# Patient Record
Sex: Male | Born: 1980
Health system: Southern US, Community
[De-identification: ages and names within clinical notes are randomized; demographics above are authoritative.]

## PROBLEM LIST (undated history)

## (undated) DIAGNOSIS — I10 Essential (primary) hypertension: Secondary | ICD-10-CM

## (undated) DIAGNOSIS — F419 Anxiety disorder, unspecified: Secondary | ICD-10-CM

## (undated) DIAGNOSIS — K602 Anal fissure, unspecified: Secondary | ICD-10-CM

## (undated) DIAGNOSIS — N189 Chronic kidney disease, unspecified: Secondary | ICD-10-CM

## (undated) DIAGNOSIS — K648 Other hemorrhoids: Secondary | ICD-10-CM

## (undated) DIAGNOSIS — K219 Gastro-esophageal reflux disease without esophagitis: Secondary | ICD-10-CM

## (undated) DIAGNOSIS — C801 Malignant (primary) neoplasm, unspecified: Secondary | ICD-10-CM

## (undated) DIAGNOSIS — K859 Acute pancreatitis without necrosis or infection, unspecified: Secondary | ICD-10-CM

## (undated) DIAGNOSIS — K5190 Ulcerative colitis, unspecified, without complications: Secondary | ICD-10-CM

## (undated) DIAGNOSIS — M1990 Unspecified osteoarthritis, unspecified site: Secondary | ICD-10-CM

## (undated) HISTORY — DX: Other hemorrhoids: K64.8

## (undated) HISTORY — PX: UMBILICAL HERNIA REPAIR: SHX196

## (undated) HISTORY — PX: APPENDECTOMY: SHX54

## (undated) HISTORY — DX: Unspecified osteoarthritis, unspecified site: M19.90

## (undated) HISTORY — DX: Gastro-esophageal reflux disease without esophagitis: K21.9

## (undated) HISTORY — DX: Anxiety disorder, unspecified: F41.9

## (undated) HISTORY — DX: Essential (primary) hypertension: I10

## (undated) HISTORY — PX: KNEE CARTILAGE SURGERY: SHX688

## (undated) HISTORY — DX: Chronic kidney disease, unspecified: N18.9

## (undated) HISTORY — PX: STOMACH SURGERY: SHX791

## (undated) HISTORY — DX: Acute pancreatitis without necrosis or infection, unspecified: K85.90

## (undated) HISTORY — PX: ANTERIOR CRUCIATE LIGAMENT REPAIR: SHX115

## (undated) HISTORY — PX: MENISCUS REPAIR: SHX5179

## (undated) HISTORY — DX: Malignant (primary) neoplasm, unspecified: C80.1

## (undated) HISTORY — DX: Ulcerative colitis, unspecified, without complications: K51.90

## (undated) HISTORY — DX: Anal fissure, unspecified: K60.2

---
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
## 2002-07-06 ENCOUNTER — Emergency Department (HOSPITAL_COMMUNITY): Admission: EM | Admit: 2002-07-06 | Discharge: 2002-07-06 | Payer: Self-pay | Admitting: Emergency Medicine

## 2003-04-28 ENCOUNTER — Emergency Department (HOSPITAL_COMMUNITY): Admission: EM | Admit: 2003-04-28 | Discharge: 2003-04-28 | Payer: Self-pay | Admitting: Emergency Medicine

## 2003-07-15 ENCOUNTER — Encounter (INDEPENDENT_AMBULATORY_CARE_PROVIDER_SITE_OTHER): Payer: Self-pay | Admitting: *Deleted

## 2003-07-15 ENCOUNTER — Inpatient Hospital Stay (HOSPITAL_COMMUNITY): Admission: EM | Admit: 2003-07-15 | Discharge: 2003-07-17 | Payer: Self-pay | Admitting: Emergency Medicine

## 2004-02-12 ENCOUNTER — Ambulatory Visit: Payer: Self-pay | Admitting: Internal Medicine

## 2004-07-25 ENCOUNTER — Ambulatory Visit: Payer: Self-pay | Admitting: Internal Medicine

## 2004-09-05 ENCOUNTER — Ambulatory Visit: Payer: Self-pay | Admitting: Internal Medicine

## 2004-09-08 ENCOUNTER — Ambulatory Visit: Payer: Self-pay | Admitting: Internal Medicine

## 2004-09-08 ENCOUNTER — Ambulatory Visit: Payer: Self-pay | Admitting: Cardiology

## 2004-09-12 ENCOUNTER — Encounter (INDEPENDENT_AMBULATORY_CARE_PROVIDER_SITE_OTHER): Payer: Self-pay | Admitting: *Deleted

## 2004-09-12 ENCOUNTER — Ambulatory Visit: Payer: Self-pay | Admitting: Internal Medicine

## 2004-10-11 ENCOUNTER — Ambulatory Visit: Payer: Self-pay | Admitting: Internal Medicine

## 2004-12-07 ENCOUNTER — Ambulatory Visit: Payer: Self-pay | Admitting: Internal Medicine

## 2005-01-02 ENCOUNTER — Ambulatory Visit: Payer: Self-pay | Admitting: Internal Medicine

## 2005-01-03 ENCOUNTER — Ambulatory Visit: Payer: Self-pay | Admitting: Internal Medicine

## 2005-01-09 ENCOUNTER — Ambulatory Visit (HOSPITAL_COMMUNITY): Admission: RE | Admit: 2005-01-09 | Discharge: 2005-01-10 | Payer: Self-pay | Admitting: Nephrology

## 2005-01-09 ENCOUNTER — Encounter (INDEPENDENT_AMBULATORY_CARE_PROVIDER_SITE_OTHER): Payer: Self-pay | Admitting: *Deleted

## 2005-01-26 ENCOUNTER — Emergency Department (HOSPITAL_COMMUNITY): Admission: EM | Admit: 2005-01-26 | Discharge: 2005-01-26 | Payer: Self-pay | Admitting: *Deleted

## 2005-02-01 ENCOUNTER — Ambulatory Visit: Payer: Self-pay | Admitting: Internal Medicine

## 2005-06-28 ENCOUNTER — Ambulatory Visit: Payer: Self-pay | Admitting: Internal Medicine

## 2005-08-11 ENCOUNTER — Ambulatory Visit: Payer: Self-pay | Admitting: Internal Medicine

## 2006-02-20 HISTORY — PX: KIDNEY TRANSPLANT: SHX239

## 2006-09-18 ENCOUNTER — Ambulatory Visit: Payer: Self-pay | Admitting: Internal Medicine

## 2006-09-19 ENCOUNTER — Encounter (INDEPENDENT_AMBULATORY_CARE_PROVIDER_SITE_OTHER): Payer: Self-pay | Admitting: *Deleted

## 2006-09-19 ENCOUNTER — Encounter: Payer: Self-pay | Admitting: Internal Medicine

## 2006-09-19 ENCOUNTER — Ambulatory Visit: Payer: Self-pay | Admitting: Internal Medicine

## 2006-09-21 DIAGNOSIS — N189 Chronic kidney disease, unspecified: Secondary | ICD-10-CM

## 2006-09-21 HISTORY — DX: Chronic kidney disease, unspecified: N18.9

## 2006-09-25 ENCOUNTER — Emergency Department (HOSPITAL_COMMUNITY): Admission: EM | Admit: 2006-09-25 | Discharge: 2006-09-26 | Payer: Self-pay | Admitting: Emergency Medicine

## 2006-12-21 ENCOUNTER — Ambulatory Visit: Payer: Self-pay | Admitting: Internal Medicine

## 2007-02-22 ENCOUNTER — Ambulatory Visit (HOSPITAL_COMMUNITY): Admission: RE | Admit: 2007-02-22 | Discharge: 2007-02-22 | Payer: Self-pay | Admitting: Emergency Medicine

## 2007-02-22 ENCOUNTER — Emergency Department (HOSPITAL_COMMUNITY): Admission: EM | Admit: 2007-02-22 | Discharge: 2007-02-22 | Payer: Self-pay | Admitting: Emergency Medicine

## 2007-04-16 DIAGNOSIS — N184 Chronic kidney disease, stage 4 (severe): Secondary | ICD-10-CM | POA: Insufficient documentation

## 2007-04-16 DIAGNOSIS — I10 Essential (primary) hypertension: Secondary | ICD-10-CM | POA: Insufficient documentation

## 2007-04-16 DIAGNOSIS — K5100 Ulcerative (chronic) pancolitis without complications: Secondary | ICD-10-CM | POA: Insufficient documentation

## 2007-07-23 ENCOUNTER — Emergency Department (HOSPITAL_COMMUNITY): Admission: EM | Admit: 2007-07-23 | Discharge: 2007-07-24 | Payer: Self-pay | Admitting: Emergency Medicine

## 2008-02-03 ENCOUNTER — Telehealth: Payer: Self-pay | Admitting: Internal Medicine

## 2008-02-20 ENCOUNTER — Ambulatory Visit: Payer: Self-pay | Admitting: Internal Medicine

## 2008-02-20 DIAGNOSIS — K602 Anal fissure, unspecified: Secondary | ICD-10-CM | POA: Insufficient documentation

## 2008-02-20 LAB — CONVERTED CEMR LAB
ALT: 18 units/L (ref 0–53)
AST: 16 units/L (ref 0–37)
Albumin: 4.3 g/dL (ref 3.5–5.2)
Alkaline Phosphatase: 59 units/L (ref 39–117)
BUN: 19 mg/dL (ref 6–23)
Basophils Absolute: 0 10*3/uL (ref 0.0–0.1)
CO2: 27 meq/L (ref 19–32)
CRP, High Sensitivity: <1 — ABNORMAL LOW (ref 0.00–5.00)
Chloride: 107 meq/L (ref 96–112)
Eosinophils Relative: 2.6 % (ref 0.0–5.0)
GFR calc non Af Amer: 55 mL/min
Glucose, Bld: 114 mg/dL — ABNORMAL HIGH (ref 70–99)
Lymphocytes Relative: 55.1 % — ABNORMAL HIGH (ref 12.0–46.0)
Monocytes Absolute: 0.7 10*3/uL (ref 0.1–1.0)
Monocytes Relative: 16.7 % — ABNORMAL HIGH (ref 3.0–12.0)
Neutrophils Relative %: 24.7 % — ABNORMAL LOW (ref 43.0–77.0)
Platelets: 232 10*3/uL (ref 150–400)
RBC: 4.35 M/uL (ref 4.22–5.81)
Total Bilirubin: 0.5 mg/dL (ref 0.3–1.2)
Total Protein: 6.6 g/dL (ref 6.0–8.3)
WBC: 4.3 10*3/uL — ABNORMAL LOW (ref 4.5–10.5)

## 2008-07-28 ENCOUNTER — Ambulatory Visit: Payer: Self-pay | Admitting: Internal Medicine

## 2008-07-28 DIAGNOSIS — K219 Gastro-esophageal reflux disease without esophagitis: Secondary | ICD-10-CM | POA: Insufficient documentation

## 2008-07-29 LAB — CONVERTED CEMR LAB
Alkaline Phosphatase: 61 units/L (ref 39–117)
BUN: 20 mg/dL (ref 6–23)
Basophils Absolute: 0 10*3/uL (ref 0.0–0.1)
Basophils Relative: 0.6 % (ref 0.0–3.0)
CO2: 30 meq/L (ref 19–32)
Calcium: 9.7 mg/dL (ref 8.4–10.5)
Chloride: 110 meq/L (ref 96–112)
Creatinine, Ser: 1.6 mg/dL — ABNORMAL HIGH (ref 0.4–1.5)
Eosinophils Absolute: 0 10*3/uL (ref 0.0–0.7)
Eosinophils Relative: 0.2 % (ref 0.0–5.0)
GFR calc non Af Amer: 54.83 mL/min (ref >60)
Hemoglobin: 13.6 g/dL (ref 13.0–17.0)
Lymphs Abs: 1.5 10*3/uL (ref 0.7–4.0)
Platelets: 237 10*3/uL (ref 150.0–400.0)
RDW: 12.5 % (ref 11.5–14.6)
Sodium: 141 meq/L (ref 135–145)
TSH: 1.04 microintl units/mL (ref 0.35–5.50)
Total Protein: 7.4 g/dL (ref 6.0–8.3)
WBC: 6 10*3/uL (ref 4.5–10.5)

## 2008-09-29 ENCOUNTER — Telehealth: Payer: Self-pay | Admitting: Internal Medicine

## 2008-09-30 ENCOUNTER — Ambulatory Visit: Payer: Self-pay | Admitting: Internal Medicine

## 2008-09-30 ENCOUNTER — Encounter: Payer: Self-pay | Admitting: Internal Medicine

## 2008-10-01 ENCOUNTER — Encounter: Payer: Self-pay | Admitting: Internal Medicine

## 2010-07-05 NOTE — Assessment & Plan Note (Signed)
Joe [name] OFFICE NOTE   Joe, [name]                  MRN:          [id number]  DATE:12/21/2006                            DOB:          20-Aug-1980    HISTORY:  Joe [name] presents today, two months after undergoing successful  renal transplantation at [facility].  He presents  today for routine followup as requested as well as complaints of rectal  bleeding.  He last underwent completely colonoscopy on September 19, 2006 pre-  transplant.  He was found to have mildly active universal ulcerative  colitis, both endoscopically and microscopically.  Post transplant, he  has been on prednisone 20 mg daily, Mifortic as directed, Prograf as  directed, Protonix 40 mg daily, aspirin, Bactrim 3 times weekly, and  Caltrate q.i.d.   He reports no problems with abdominal pain aside from some weakness and  discomfort associated with his surgical incision.  His bowel habits have  been a bit loose, though in an erratic nature.  His rectal bleeding is  red blood associated with pain with defecation.   PHYSICAL EXAMINATION:  A well-appearing male in no acute distress.  Blood pressure is 130/52, heart rate 100, weight is 253.6 pounds.  HEENT:  Sclerae are anicteric.  LUNGS:  Clear.  HEART:  Regular.  ABDOMEN:  Soft and nontender with well-healed incision in the right  lower quadrant with fullness consistent with the transplanted kidney.  No hernia.  RECTAL:  Posterior fissure.  No other abnormalities.   IMPRESSION:  1. Universal ulcerative colitis, seemingly stable.  Recent colonoscopy      with mildly active disease.  I am hopeful that his      immunosuppression therapy for his transplant will be beneficial for      his colitis.  2. Current problems with rectal pain and bleeding due to fissure.  3. Status post renal transplant in August, 2008.   RECOMMENDATIONS:  1. Fiber supplementation to  improve consistency of stools.  He has      been advised not to use fiber supplements around the time of his      other medications, as there may be interaction and decreased      absorption.  2. Anusol suppositories at night.  3. Sitz baths p.r.n.  4. Routine GI followup in six months, sooner if clinically indicated.  5. Ongoing post transplant care with [facility] personnel.     Joe [name]. Joe Pastor, MD  Electronically Signed    JNP/MedQ  DD: 12/21/2006  DT: 12/22/2006  Job #: [id number]   cc:   Joe [name], M.D.

## 2010-07-08 NOTE — H&P (Signed)
NAMEJUNUIS, VIDOVICH                     ACCOUNT NO.:  [id number]   MEDICAL RECORD NO.:  [id number]                   PATIENT TYPE:  INP   LOCATION:  0106                                 FACILITY:  [place]:  Edsel Petrin. Dalbert Batman, M.D.             DATE OF BIRTH:  May 04, 1980   DATE OF ADMISSION:  07/15/2003  DATE OF DISCHARGE:                                HISTORY & PHYSICAL   CHIEF COMPLAINT:  Abdominal pain and vomiting.   HISTORY OF PRESENT ILLNESS:  This is a 30 year old white male who has a  history of ulcerative colitis and a history of alcohol induced pancreatitis.  His ulcerative colitis has been relatively quiet lately and he is on minimal  medications. He reports a five day history of right lower quadrant pain  which has been getting worse. He has been nauseated. He vomited several  times today. His stool frequency has increased the last couple of days, but  he has not seen any blood. He denies any fever or chills. He was evaluated  by Dr. Delfin Edis in the emergency room. A CT scan was performed which  shows a dilated appendix and some inflammatory changes consistent with acute  appendicitis. No other acute abnormalities were noted. The colon did not  look inflamed. I was asked to see him. He is admitted for appendectomy.   PAST MEDICAL HISTORY:  1. Ulcerative colitis diagnosed in January 2000.  2. Alcohol induced pancreatitis in 1998.  3. Status post one open and two arthroscopic surgeries of the left knee.  4. History of acne with Accutane use in the past.  5. Gastroesophageal reflux disease.   CURRENT MEDICATIONS:  Asacol 400 mg t.i.d., multivitamins, Darvocet N-100  p.r.n., and Aciphex p.r.n.   DRUG ALLERGIES:  None known.   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  He is married and they  have no children. Denies tobacco. Drinks occasionally three beers at a time,  last time on Sunday, Jul 12, 2003.   FAMILY HISTORY:  Mother and father  are here with him. They are healthy and  have no major medical problems. There is no family history of cancer,  inflammatory bowel disease, peptic ulcer disease, hypertension, coronary  artery disease, or stroke.   REVIEW OF SYSTEMS:  Recent re-injury of his left knee. Urination has been  normal. No chills or rigors. He has been diaphoretic. Otherwise, review of  systems is negative.   PHYSICAL EXAMINATION:  GENERAL: Somewhat somnolent, but arousable and  appropriate young white man in moderate distress. He does look ill.  He is  somewhat overweight.  VITAL SIGNS: Temperature 97.1, pulse 65, respirations 22, blood pressure  137/92.  HEENT:  Eyes--sclera clear. Extraocular movements intact. Ears, nose, mouth,  throat, lips, tongue, and oropharynx are without gross lesions.  NECK: Supple and nontender. No adenopathy. No tenderness. No jugular venous  distention.  LUNGS: Clear to auscultation. No chest  wall tenderness.  HEART: Regular rate and rhythm with no murmur.  BREASTS: Not examined.  ABDOMEN: Mostly soft, except in the right upper lobe where he has exquisite  tenderness with involuntary guarding and direct rebound. I do not feel a  mass, but he really is guarding too much to know for sure.  Bowel sounds are  diminished. I see no scars or hernias.  GENITOURINARY: All of his pubic hair has been shaved. I do not see any  inguinal adenopathy, hernias, or penile or scrotal lesions.  EXTREMITIES: He moves all four extremities well without any pain or  deformity.  NEUROLOGIC: No gross motor or sensory deficits.   ADMISSION DATA:  CT scan shows a dilated thick walled appendix with  surrounding inflammatory changes, but no abscess.   Hemoglobin 15.2, white blood cell count 13,400.  Comprehensive metabolic  panel normal. Amylase 58, lipase 18. Urinalysis normal.   ASSESSMENT:  1. Acute appendicitis.  2. History of ulcerative colitis.  3. History of alcohol induced pancreatitis.    PLAN:  1. The patient will be taken to the operating room for diagnostic     laparoscopy and appendectomy.  2. I have discussed the indications and details of surgery with the patient,     his wife, and both of his parents. The risks and complications have been     outlined, including but not limited to bleeding, postoperative infection,     conversion to open laparotomy, injury to adjacent organs, reoperation for     complications, wound problem such as infection or hernia, or cardiac,     pulmonary, or thromboembolic problems. They all seem to understand these     issues well. At this time everyone's questions are answered. The patient     is in agreement with this plan.                                               Edsel Petrin. Dalbert Batman, M.D.    HMI/MEDQ  D:  07/15/2003  T:  07/15/2003  Job:  [id number]   cc:   Delfin Edis, M.D. [facility]

## 2010-07-08 NOTE — Discharge Summary (Signed)
NAMEJUELZ, NEWSUM                     ACCOUNT NO.:  [id number]   MEDICAL RECORD NO.:  [id number]                   PATIENT TYPE:  INP   LOCATION:  [id number]                                 FACILITY:  [place]:  Edsel Petrin. Dalbert Batman, M.D.             DATE OF BIRTH:  Jul 02, 1980   DATE OF ADMISSION:  07/15/2003  DATE OF DISCHARGE:  07/17/2003                                 DISCHARGE SUMMARY   FINAL DIAGNOSES:  1. Acute appendicitis.  2. History of ulcerative colitis.  3. History of alcohol induced pancreatitis.   OPERATION:  Laparoscopic appendectomy -- date 07/15/2003.   HISTORY:  This is a 30 year old white male with a history of ulcerative  colitis and a history of alcohol induced pancreatitis in the past.  Both  problems had been relatively quiet lately on minimal medications.  He  presents with a 5-day history of right lower quadrant pain getting worse  with nausea and vomiting. He came to the emergency room.  He was evaluated  by Dr. Delfin Edis.  A CT scan showed a dilated appendix and some  inflammatory changes in the area consistent with acute appendicitis.  I was  called to see him.   PHYSICAL EXAMINATION:  GENERAL:  A somewhat overweight young man in mild  distress.  VITAL SIGNS:  Temperature 97.1, pulse 65, respirations 22.  LUNGS:  Clear to auscultation.  HEART:  Regular rate and rhythm.  No murmur.  ABDOMEN:  Soft.  There was some right-sided abdominal tenderness which was  localized with direct guarding and direct rebound, but no mass.   ADMISSION DATA:  White blood cell count 13,400.  The rest of his lab work  looked normal.  The CT scan was discussed above.   HOSPITAL COURSE:  The patient was taken to the operating room and underwent  laparoscopic appendectomy.  He did have an acutely inflamed appendix.  The  final pathology report did show acute appendicitis.  Postoperatively he did  relatively well.  He progressed in his diet and activities without  problems  and was discharged home on 07/17/2003.  At that time he was afebrile, alert,  tolerating a regular diet; was having loose stools as usual.  His abdomen  was soft; and his wounds looked fine.  He was asked to return to see me in  the office in 3 weeks.                                               Edsel Petrin. Dalbert Batman, M.D.    HMI/MEDQ  D:  07/26/2003  T:  07/26/2003  Job:  [id number]   cc:   Biagio Borg, M.D. [facility]   Delfin Edis, M.D. [facility]

## 2010-07-08 NOTE — Op Note (Signed)
Joe [name], Joe [name]                     ACCOUNT NO.:  [id number]   MEDICAL RECORD NO.:  [id number]                   PATIENT TYPE:  INP   LOCATION:  0106                                 FACILITY:  [place]:  Edsel Petrin. Dalbert Batman, M.D.             DATE OF BIRTH:  1980-07-06   DATE OF PROCEDURE:  07/15/2003  DATE OF DISCHARGE:                                 OPERATIVE REPORT   PREOPERATIVE DIAGNOSES:  Acute appendicitis.   POSTOPERATIVE DIAGNOSES:  Acute appendicitis.   OPERATION PERFORMED:  Laparoscopic appendectomy.   SURGEON:  Edsel Petrin. Dalbert Batman, M.D.   INDICATIONS FOR PROCEDURE:  This is a 30 year old white man with a history  of ulcerative colitis.  He presents with a 4-5 day history of right lower  quadrant pain and nausea.  For the past 3-4 hours, he has vomited multiple  times.  His stool frequency has increased but he has not seen any blood.  On  exam, he had localized tenderness in the right lower quadrant. A CT scan was  consistent with acute appendicitis. He was brought to the operating room  urgently.   FINDINGS:  The patient had acute appendicitis. The appendix was markedly  inflamed and quite large and swollen but it had not ruptured and there was  no an abscess.  There was inflammatory peel where the appendix was tracking  up the right pericolic gutter and against the pericolonic fat but we were  able to tease this bluntly apart without any injury to the colon. The  terminal ileum looked normal. The liver and gallbladder looked normal. The  pelvis looked normal except for a scant amount of watery bloody fluid.   TECHNIQUE:  Following the induction of general endotracheal anesthesia, the  patient's abdomen was prepped and draped in a sterile fashion.  Marcaine  0.5% with epinephrine was used as a local infiltration anesthetic.  A  transverse incision was made at the superior rim of the umbilicus.  The  fascia was incised in the midline and the abdominal  cavity entered under  direct vision. A 10 mm Hasson trocar was inserted and secured with a  pursestring suture of #0 Vicryl. Pneumoperitoneum was created. The video  camera was inserted with visualization and findings as described above.  A 5  mm trocar was placed in the right upper quadrant and a 12 mm trocar placed  in the suprapubic area.  We gently dissected some omentum off of the cecum  and the terminal ileum where it was adherent. We could then identify the  appendix coursing retrograde up the right pericolic gutter. With blunt  dissection, we carefully teased the appendix and the cecum apart until we  were able to flip the tip of the appendix up and control the tip of the  appendix with an Endoloop tie. Using the harmonic scalpel, we mobilized the  appendix further from its lateral attachments and then dissected through the  mesoappendix and appendiceal artery with the harmonic scalpel. We were able  to dissect the appendix all the way back to its insertion on the cecum and  to clearly see this insertion at which point the appendix became soft and  looked normal. We set up the stapled division of the appendix. We used an  Insurance underwriter. This was placed over the base of the appendix at the leve  of the cecum, held in place for about 30 seconds and then fired and removed.  The staple line was complete and looks quite secure. The appendix was placed  in a specimen bag and removed.  The operative field and the pelvis were  irrigated with saline. There was no bleeding whatsoever. The staple line was  again inspected and looked fine. The trocars were removed under direct  vision and there was no bleeding from the trocar sites. The pneumoperitoneum  was released.  The fascia at the umbilicus and the fascia in the suprapubic  trocar site were closed with #0 Vicryl sutures.  After irrigating the  wounds, we closed  the skin with subcuticular sutures of 4-0 Vicryl and Steri-Strips. Clean   bandages were placed and the patient taken to the recovery room in stable  condition. Estimated blood loss was about 20 mL.  Complications none.  Sponge, needle and instrument counts were correct.                                               Edsel Petrin. Dalbert Batman, M.D.    HMI/MEDQ  D:  07/15/2003  T:  07/15/2003  Job:  [id number]   cc:   Delfin Edis, M.D. [facility]

## 2010-07-08 NOTE — Op Note (Signed)
Joe [name], Joe [name]           ACCOUNT NO.:  [id number]   MEDICAL RECORD NO.:  [id number]          PATIENT TYPE:  OIB   LOCATION:  5502                         FACILITY:  [facility]   PHYSICIAN:  Windy Kalata, M.D.DATE OF BIRTH:  1980-04-16   DATE OF PROCEDURE:  01/09/2005  DATE OF DISCHARGE:                                 OPERATIVE REPORT   TYPE OF PROCEDURE:  Left percutaneous renal biopsy.   REASON:  Renal insufficiency of unknown etiology.   PROCEDURE NOTE:  Risks of procedure explained to patient and informed  consent obtained.  The left flank was prepped and draped in sterile fashion.  Local anesthesia was obtained with 10 cc of 1% lidocaine.  Under direct  ultrasound guidance, a 15-gauge ASAP biopsy needle was placed in the  inferior pole of the left kidney on two occasions with the return of two  cores of renal tissue.  The patient tolerated the procedure well.           ______________________________  Windy Kalata, M.D.     MTM/MEDQ  D:  01/09/2005  T:  01/09/2005  Job:  [id number]

## 2010-07-08 NOTE — H&P (Signed)
Joe [name], Joe [name]                     ACCOUNT NO.:  [id number]   MEDICAL RECORD NO.:  [id number]                   PATIENT TYPE:  INP   LOCATION:  0106                                 FACILITY:  [facility]   PHYSICIAN:  Delfin Edis, M.D. [facility]               DATE OF BIRTH:  08/26/1980   DATE OF ADMISSION:  07/15/2003  DATE OF DISCHARGE:                                HISTORY & PHYSICAL   CHIEF COMPLAINT:  Severe right-sided abdominal pain.   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 He has a history of alcoholic pancreatitis at age 41. He has  not had any recurrences of this. In 2000 he was diagnosed with colonoscopy  with universal ulcerative colitis which was described as mild by Dr. Henrene Pastor.  Since then he has been on Asacol and currently takes 1.6 gm t.i.d. He also  takes multivitamins and PPI.  GI-wise, he has been doing very well with  little if any GI symptoms other than occasional reflux and has generally one  to two bowel movements a day. Some times they are loose, but often they are  formed. He has not seen any rectal bleeding.  He has not had any significant  abdominal pain or weight loss. His appetite is good.   Four days ago he had onset of right-sided abdominal pain, it worsened  yesterday. He got some temporary relief after taking two Darvocet yesterday.  He was set up to see Dr. Henrene Pastor at about 2:00 today, but he had recurrence of  pain on a severity of 10/10 this morning and was sent to the emergency room  for evaluation. He has had some mild queasiness and this turned to nausea,  and he had his first episodes of a minor amount of emesis this morning  before getting to the emergency room and then had a bigger episode of emesis  after getting the Demerol in the emergency room.   Labs here in the ED show only an increase in white blood cell count to 13.4  thousand and a slight increase in his ALT up to 46.  Otherwise,  comprehensive metabolic profile, lipase, and amylase are normal. He is  drinking contrast now in order to get a CT scan of the abdomen.   ALLERGIES:  He denies.   CURRENT MEDICATIONS:  1. Asacol 400 mg four p.o. t.i.d.  2. Multivitamins once daily.  3. Darvocet N-100 as needed, rarely uses this.  4. Aciphex p.r.n., rarely uses.   PAST MEDICAL HISTORY:  1. Alcoholic pancreatitis in approximately 1998.  2. History of ulcerative colitis diagnosed in 2000.  3. Status post one open and two arthroscopic surgeries to his left knee;     recent re-injury of the same knee and may need another arthroscopy.  4. History of acne treated with Accutane in the past.  5. Gastroesophageal reflux disease.   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 No tobacco. He admits to about three beers at a  time on occasion and he last drank on Sunday, May 22nd.   FAMILY HISTORY:  No cancer. No inflammatory bowel disease. No  peptic ulcer  disease. No hypertension. No coronary artery disease. No strokes. Generally,  negative for any significant family medical history.   REVIEW OF SYSTEMS:  ORTHOPEDIC:  Again, recent re-injury of his left knee.  He still has a scab/scar on the left knee which was injured again about  three weeks ago. Dr. Metta Clines follows and he may need a rescope of the  knee. No other arthralgias. No sacroiliac pain.  HEENT:  No visual problems.  No difficulty swallowing. GU: He is urinating normally and the urine does  not look funny.  ID: Some chills. No rigors. Some diaphoresis this morning.  Otherwise, review of systems is  negative.   PHYSICAL EXAMINATION:  GENERAL: The patient is somnolent having had Demerol  50 mg earlier, but he is not confused when he is awake.  VITAL SIGNS: Temperature 97.1, blood pressure 137/92, pulse 65, respirations  22.  HEENT: Extraocular movements intact. There is no conjunctival pallor. No   icterus. The oropharynx is moist. There is some slight nonspecific coating  on the tongue, but there is no lesion or bleeding.  NECK: No JVD, no masses, no thyromegaly.  CHEST: Clear to auscultation and percussion bilaterally.  COR: Regular rate and rhythm. No murmurs, rubs, or gallops.  ABDOMEN: Soft and quiet. There is diffuse tenderness with some early rebound  in the right lower quadrant. No guarding.  RECTAL EXAM: Deferred.  EXTREMITIES: No clubbing, cyanosis, or edema.  NEUROLOGIC: He is sleepy, but awakens with some difficulty. He is alert and  oriented times three when awake. There is no tremor. Grip is 5/5  bilaterally.  PSYCHIATRIC: The patient is appropriate, but again sleepy.   LABORATORY DATA:  White blood cell count 13.4 with differential showing  neutrophils within normal limits, increasing granulocyte count of 10.3.  Lymphs, monos, eos, and basophils are all within normal limits. Hemoglobin  15.2, hematocrit 44.5, platelet count 303,000. MCV within normal limits.  Amylase is 58, lipase 18, total bilirubin 0.7, alkaline phosphatase 77, AST  34, ALT 46.  CT scan of the abdomen and pelvis pending. Urinalysis pending.   ASSESSMENT:  1. Acute abdominal pain. The history does not seem to be consistent with a     flare of ulcerative colitis, but since he has this history, need to rule     it out. Also need to rule out pancreatitis, though the amylase and lipase     are within normal limits. Question whether this is appendicitis, question     nephrolithiasis, question biliary origin to the pain.  2. History of ulcerative colitis.  3. History of alcoholic pancreatitis. Again, the amylase and lipase are     within normal limits.  4. Repeated history of knee injury requiring several surgeries.   PLAN:  Await the CT scan and obtain urinalysis.  The patient is to be  admitted to Dr. Sydell Axon Brodie's service until the source of his pain is  figured out and he improves.    Azucena Freed, P.A. [facility]                   Delfin Edis, M.D. [facility]    SG/MEDQ  D:  07/15/2003  T:  07/15/2003  Job:  [id number]

## 2010-09-05 ENCOUNTER — Other Ambulatory Visit: Payer: Self-pay | Admitting: Internal Medicine

## 2010-09-05 DIAGNOSIS — K602 Anal fissure, unspecified: Secondary | ICD-10-CM

## 2010-09-06 MED ORDER — LIDOCAINE-HYDROCORTISONE ACE 3-0.5 % RE CREA: 1.0000 | TOPICAL_CREAM | Freq: Two times a day (BID) | RECTAL | Status: DC

## 2010-09-06 NOTE — Telephone Encounter (Signed)
Rx Sent to pharmacy.  

## 2010-10-26 ENCOUNTER — Encounter: Payer: Self-pay | Admitting: Internal Medicine

## 2010-11-17 LAB — CBC
HCT: 32.8 — ABNORMAL LOW
RBC: 3.73 — ABNORMAL LOW
RDW: 13.1
WBC: 6.8

## 2010-11-17 LAB — POCT I-STAT, CHEM 8
BUN: 25 — ABNORMAL HIGH
Chloride: 105
HCT: 33 — ABNORMAL LOW
Hemoglobin: 11.2 — ABNORMAL LOW
Sodium: 139

## 2010-11-17 LAB — DIFFERENTIAL
Basophils Absolute: 0.1
Basophils Relative: 2 — ABNORMAL HIGH
Eosinophils Relative: 1
Lymphs Abs: 2.2
Monocytes Absolute: 0.6
Monocytes Relative: 9

## 2010-11-17 LAB — URINALYSIS, ROUTINE W REFLEX MICROSCOPIC
Hgb urine dipstick: NEGATIVE
Nitrite: NEGATIVE
Protein, ur: NEGATIVE
Specific Gravity, Urine: 1 — ABNORMAL LOW

## 2010-12-05 LAB — URINALYSIS, ROUTINE W REFLEX MICROSCOPIC
Bilirubin Urine: NEGATIVE
Ketones, ur: NEGATIVE
Urobilinogen, UA: 0.2

## 2010-12-05 LAB — URINE MICROSCOPIC-ADD ON

## 2010-12-05 LAB — COMPREHENSIVE METABOLIC PANEL
AST: 12
CO2: 21
Calcium: 9.3
GFR calc Af Amer: 12 — ABNORMAL LOW
Glucose, Bld: 98
Total Protein: 7

## 2010-12-05 LAB — DIFFERENTIAL
Basophils Relative: 0
Monocytes Absolute: 1 — ABNORMAL HIGH
Neutro Abs: 15.5 — ABNORMAL HIGH

## 2010-12-05 LAB — CBC
MCHC: 33.7
RBC: 3.72 — ABNORMAL LOW
RDW: 13.8
WBC: 17.7 — ABNORMAL HIGH

## 2010-12-30 DIAGNOSIS — Z940 Kidney transplant status: Secondary | ICD-10-CM | POA: Insufficient documentation

## 2010-12-30 DIAGNOSIS — D849 Immunodeficiency, unspecified: Secondary | ICD-10-CM | POA: Insufficient documentation

## 2010-12-30 DIAGNOSIS — I151 Hypertension secondary to other renal disorders: Secondary | ICD-10-CM | POA: Insufficient documentation

## 2011-11-22 ENCOUNTER — Encounter: Payer: Self-pay | Admitting: Internal Medicine

## 2012-05-22 ENCOUNTER — Telehealth: Payer: Self-pay | Admitting: Internal Medicine

## 2012-05-22 NOTE — Telephone Encounter (Signed)
Received 6 pages from [facility], sent to Dr. Henrene Pastor. 05/22/12/ss

## 2014-06-22 ENCOUNTER — Telehealth: Payer: Self-pay | Admitting: General Practice

## 2014-06-22 ENCOUNTER — Encounter: Payer: Self-pay | Admitting: Internal Medicine

## 2014-06-22 NOTE — Telephone Encounter (Signed)
Pt called in and would like to see if you would take him back on as pt. He was a pt 19 years ago   Best number [phone]

## 2014-06-22 NOTE — Telephone Encounter (Signed)
Ok with me 

## 2014-08-18 ENCOUNTER — Encounter: Payer: Self-pay | Admitting: Internal Medicine

## 2014-08-18 ENCOUNTER — Ambulatory Visit (INDEPENDENT_AMBULATORY_CARE_PROVIDER_SITE_OTHER): Payer: 59 | Admitting: Internal Medicine

## 2014-08-18 VITALS — BP 122/80 | HR 72 | Ht 74.0 in | Wt 279.5 lb

## 2014-08-18 DIAGNOSIS — K219 Gastro-esophageal reflux disease without esophagitis: Secondary | ICD-10-CM

## 2014-08-18 DIAGNOSIS — K51919 Ulcerative colitis, unspecified with unspecified complications: Secondary | ICD-10-CM

## 2014-08-18 DIAGNOSIS — Z1211 Encounter for screening for malignant neoplasm of colon: Secondary | ICD-10-CM | POA: Diagnosis not present

## 2014-08-18 DIAGNOSIS — R790 Abnormal level of blood mineral: Secondary | ICD-10-CM

## 2014-08-18 MED ORDER — NA SULFATE-K SULFATE-MG SULF 17.5-3.13-1.6 GM/177ML PO SOLN: 1.0000 | Freq: Once | ORAL | Status: DC

## 2014-08-18 NOTE — Progress Notes (Signed)
HISTORY OF PRESENT ILLNESS:  Joe [name] is a 34 y.o. 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 He was last evaluated 09/30/2008 when he underwent surveillance colonoscopy. Initial diagnosis of ulcerative colitis in 2000. The colon was grossly normal. Biopsies in the proximal colon revealed chronic mildly active colitis and edema with rare granuloma. Biopsies from the left colon were normal. Follow-up in 2 years recommended. He acknowledges that he is overdue for surveillance. He continues on immunosuppressive medication as listed. His Nephrologist at [facility] Earlier Today. Doing Extremely Well with Current Creatinine 1.19. Magnesium Level Has Been Minimally Low for Which His Nephrologist Suggested Changing from Pantoprazole to Ranitidine. The patient finds that off PPI severe heartburn. He denies dysphagia. He reports 2-4 formed bowel movements per day, which is his norm. No mucus, blood, or pain. He is accompanied today by his 37-year-old son  REVIEW OF SYSTEMS:  All non-GI ROS negative except for anxiety  Past Medical History  Diagnosis Date  . Ulcerative colitis   . Hypertension   . Chronic renal failure     post kidney transplantation  . GERD (gastroesophageal reflux disease)   . Anal fissure   . Internal hemorrhoids   . Pancreatitis     Past Surgical History  Procedure Laterality Date  . Anterior cruciate ligament repair Left   . Knee cartilage surgery Left     x2  . Meniscus repair Left   . Appendectomy    . Kidney transplant  2008    Social History Joe [name]  reports that he has never smoked. He quit smokeless tobacco use about 9 years ago. His smokeless tobacco use included Chew. He reports that he drinks alcohol. He reports that he does not use illicit drugs.  family history  includes Diabetes in his paternal grandmother; Hyperthyroidism in his father; Kidney cancer in his maternal grandfather.  Allergies  Allergen Reactions  . Mercaptopurine Other (See Comments)    Other Reaction: fever, chills  . Mesalamine Other (See Comments)    Other Reaction: ESRD       PHYSICAL EXAMINATION: Vital signs: BP 122/80 mmHg  Pulse 72  Ht 6\' 2"  (1.88 m)  Wt 279 lb 8 oz (126.78 kg)  BMI 35.87 kg/m2  Constitutional: generally well-appearing, no acute distress Psychiatric: alert and oriented x3, cooperative Eyes: extraocular movements intact, anicteric, conjunctiva pink Mouth: oral pharynx moist, no lesions Neck: supple no lymphadenopathy Cardiovascular: heart regular rate and rhythm, no murmur Lungs: clear to auscultation bilaterally Abdomen: soft, obese, nontender, nondistended, no obvious ascites, no peritoneal signs, normal bowel sounds, no organomegaly. Right lower quadrant scar well healed without hernia Rectal: Deferred until colonoscopy Extremities: no lower extremity edema bilaterally Skin: no lesions on visible extremities Neuro: No focal deficits. No asterixis.   ASSESSMENT:  #1. Long-standing history of universal ulcerative colitis. Last surveillance colonoscopy August 2010. Minimal proximal chronic colitis without dysplasia. #2. Status post renal transplantation on chronic immunosuppressive therapy #3. Chronic GERD. Well controlled on PPI. #4. Slightly low magnesium. Nephrologist wondered if this was a PPI effect. Not certain, but patient may be able to take a simple magnesium supplement if okay with his nephrologist since he seems to have incapacitating heartburn off PPI   PLAN:  #1. Surveillance colonoscopy with biopsies.The nature of the procedure, as well as the risks, benefits, and alternatives were carefully and thoroughly reviewed with the patient.  Ample time for discussion and questions allowed. The patient understood, was satisfied, and agreed  to proceed. Su prep #2. Patient discuss with nephrologist prospects of magnesium supplementation

## 2014-08-18 NOTE — Patient Instructions (Signed)

## 2014-10-07 ENCOUNTER — Ambulatory Visit (AMBULATORY_SURGERY_CENTER): Payer: 59 | Admitting: Internal Medicine

## 2014-10-07 ENCOUNTER — Encounter: Payer: Self-pay | Admitting: Internal Medicine

## 2014-10-07 VITALS — BP 102/65 | HR 71 | Temp 96.9°F | Resp 29 | Ht 74.0 in | Wt 279.0 lb

## 2014-10-07 DIAGNOSIS — K51919 Ulcerative colitis, unspecified with unspecified complications: Secondary | ICD-10-CM

## 2014-10-07 DIAGNOSIS — Z8719 Personal history of other diseases of the digestive system: Secondary | ICD-10-CM

## 2014-10-07 DIAGNOSIS — K5150 Left sided colitis without complications: Secondary | ICD-10-CM | POA: Diagnosis not present

## 2014-10-07 DIAGNOSIS — K529 Noninfective gastroenteritis and colitis, unspecified: Secondary | ICD-10-CM | POA: Diagnosis not present

## 2014-10-07 MED ORDER — SODIUM CHLORIDE 0.9 % IV SOLN: 500.0000 mL | INTRAVENOUS | Status: DC

## 2014-10-07 NOTE — Progress Notes (Signed)
Called to room to assist during endoscopic procedure.  Patient ID and intended procedure confirmed with present staff. Received instructions for my participation in the procedure from the performing physician.  

## 2014-10-07 NOTE — Progress Notes (Signed)
Epic down for procedure  Paper chart used for assesment and meds and inputed post procedure  Vitals were supposed to be recorded and printed out so they were not written down on paper  Post procedure though, they were erased.  Pt vitals were stable through out with no issues but they were not recorded.  Report to PACU, RN, vss, BBS= Clear.

## 2014-10-07 NOTE — Op Note (Signed)
[place]  [address] [place], [id number]   COLONOSCOPY PROCEDURE REPORT  PATIENT: Joe, [name]  MR#: [id number] BIRTHDATE: 1980-08-02 , 34  yrs. old GENDER: male ENDOSCOPIST: Eustace Quail, MD REFERRED BY:.  Self / Office PROCEDURE DATE:  10/07/2014 PROCEDURE:   Colonoscopy, surveillance and Colonoscopy with biopsy First Screening Colonoscopy - Avg.  risk and is 50 yrs.  old or older - No.  Prior Negative Screening - Now for repeat screening. N/A  History of Adenoma - Now for follow-up colonoscopy & has been > or = to 3 yrs.  N/A  Polyps removed today? No Recommend repeat exam, <10 yrs? Yes high risk ASA CLASS:   Class III INDICATIONS:Inflammatory bowel disease of the intestine if more precise diagnosis or determination of the extent / severity of activity of disease will influence immediate / future management and Inflammatory Bowel Disease ( = 8 years pancolitis or = 15 years left sided colitis).. Diagnosed with universal ulcerative colitis 2000. Last colonoscopy August 2010 with very mild right-sided disease. No dysplasia MEDICATIONS: Monitored anesthesia care and Propofol 540 mg IV  DESCRIPTION OF PROCEDURE:   After the risks benefits and alternatives of the procedure were thoroughly explained, informed consent was obtained.  The digital rectal exam revealed no abnormalities of the rectum.   The LB [id number]  endoscope was introduced through the anus and advanced to the cecum, which was identified by both the appendix and ileocecal valve. No adverse events experienced.   The quality of the prep was excellent. (Suprep was used)  The instrument was then slowly withdrawn as the colon was fully examined. Estimated blood loss is zero unless otherwise noted in this procedure report.  COLON FINDINGS: There was mild nonspecific edema and patchy erythema without gross colitis., Otherwise,A normal appearing cecum, ileocecal valve, and  appendiceal orifice were identified.  The ascending, transverse, descending, sigmoid colon, and rectum appeared unremarkable. Multiple biopsies  (total 34) were taken from all portions of the colon in an equal distribution. Retroflexed views revealed internal hemorrhoids. The time to cecum = 0.0 Withdrawal time = 13.1   The scope was withdrawn and the procedure completed. COMPLICATIONS: There were no immediate complications.  ENDOSCOPIC IMPRESSION: 1. Essentially Normal colonoscopy . Slightly edematous and erythematous mucosa as noted  RECOMMENDATIONS: 1.  Await biopsy results 2.  Repeat Colonoscopy in 3 years, if no dysplasia on biopsies.  eSigned:  Eustace Quail, MD 10/07/2014 9:19 AM   cc: The Patient

## 2014-10-07 NOTE — Patient Instructions (Signed)
YOU HAD AN ENDOSCOPIC PROCEDURE TODAY AT THE [facility]:   Refer to the procedure report that was given to you for any specific questions about what was found during the examination.  If the procedure report does not answer your questions, please call your gastroenterologist to clarify.  If you requested that your care partner not be given the details of your procedure findings, then the procedure report has been included in a sealed envelope for you to review at your convenience later.  YOU SHOULD EXPECT: Some feelings of bloating in the abdomen. Passage of more gas than usual.  Walking can help get rid of the air that was put into your GI tract during the procedure and reduce the bloating. If you had a lower endoscopy (such as a colonoscopy or flexible sigmoidoscopy) you may notice spotting of blood in your stool or on the toilet paper. If you underwent a bowel prep for your procedure, you may not have a normal bowel movement for a few days.  Please Note:  You might notice some irritation and congestion in your nose or some drainage.  This is from the oxygen used during your procedure.  There is no need for concern and it should clear up in a day or so.  SYMPTOMS TO REPORT IMMEDIATELY:   Following lower endoscopy (colonoscopy or flexible sigmoidoscopy):  Excessive amounts of blood in the stool  Significant tenderness or worsening of abdominal pains  Swelling of the abdomen that is new, acute  Fever of 100F or higher   For urgent or emergent issues, a gastroenterologist can be reached at any hour by calling [phone].   DIET: Your first meal following the procedure should be a small meal and then it is ok to progress to your normal diet. Heavy or fried foods are harder to digest and may make you feel nauseous or bloated.  Likewise, meals heavy in dairy and vegetables can increase bloating.  Drink plenty of fluids but you should avoid alcoholic beverages for 24  hours.  ACTIVITY:  You should plan to take it easy for the rest of today and you should NOT DRIVE or use heavy machinery until tomorrow (because of the sedation medicines used during the test).    FOLLOW UP: Our staff will call the number listed on your records the next business day following your procedure to check on you and address any questions or concerns that you may have regarding the information given to you following your procedure. If we do not reach you, we will leave a message.  However, if you are feeling well and you are not experiencing any problems, there is no need to return our call.  We will assume that you have returned to your regular daily activities without incident.  If any biopsies were taken you will be contacted by phone or by letter within the next 1-3 weeks.  Please call us at [phone] if you have not heard about the biopsies in 3 weeks.    SIGNATURES/CONFIDENTIALITY: You and/or your care partner have signed paperwork which will be entered into your electronic medical record.  These signatures attest to the fact that that the information above on your After Visit Summary has been reviewed and is understood.  Full responsibility of the confidentiality of this discharge information lies with you and/or your care-partner. 

## 2014-10-08 ENCOUNTER — Telehealth: Payer: Self-pay | Admitting: *Deleted

## 2014-10-08 NOTE — Telephone Encounter (Signed)
No answer left message to call if questions or concerns. 

## 2014-10-12 ENCOUNTER — Encounter: Payer: Self-pay | Admitting: Internal Medicine

## 2015-08-04 ENCOUNTER — Encounter: Payer: Self-pay | Admitting: Internal Medicine

## 2015-08-09 ENCOUNTER — Emergency Department (HOSPITAL_COMMUNITY): Payer: 59

## 2015-08-09 ENCOUNTER — Emergency Department (HOSPITAL_COMMUNITY)
Admission: EM | Admit: 2015-08-09 | Discharge: 2015-08-09 | Disposition: A | Discharge status: Home / Self Care | Payer: 59 | Attending: Emergency Medicine | Admitting: Emergency Medicine

## 2015-08-09 ENCOUNTER — Encounter (HOSPITAL_COMMUNITY): Payer: Self-pay | Admitting: Emergency Medicine

## 2015-08-09 DIAGNOSIS — K611 Rectal abscess: Secondary | ICD-10-CM | POA: Insufficient documentation

## 2015-08-09 DIAGNOSIS — Z79899 Other long term (current) drug therapy: Secondary | ICD-10-CM | POA: Insufficient documentation

## 2015-08-09 DIAGNOSIS — I129 Hypertensive chronic kidney disease with stage 1 through stage 4 chronic kidney disease, or unspecified chronic kidney disease: Secondary | ICD-10-CM | POA: Diagnosis not present

## 2015-08-09 DIAGNOSIS — Z7982 Long term (current) use of aspirin: Secondary | ICD-10-CM | POA: Diagnosis not present

## 2015-08-09 DIAGNOSIS — N189 Chronic kidney disease, unspecified: Secondary | ICD-10-CM | POA: Diagnosis not present

## 2015-08-09 DIAGNOSIS — M533 Sacrococcygeal disorders, not elsewhere classified: Secondary | ICD-10-CM | POA: Diagnosis present

## 2015-08-09 LAB — CBC WITH DIFFERENTIAL/PLATELET
BASOS ABS: 0 10*3/uL (ref 0.0–0.1)
Basophils Relative: 0 %
Eosinophils Absolute: 0.1 10*3/uL (ref 0.0–0.7)
Eosinophils Relative: 1 %
HEMATOCRIT: 41.8 % (ref 39.0–52.0)
Hemoglobin: 13.9 g/dL (ref 13.0–17.0)
LYMPHS ABS: 1.7 10*3/uL (ref 0.7–4.0)
LYMPHS PCT: 13 %
MCH: 28.5 pg (ref 26.0–34.0)
MCHC: 33.3 g/dL (ref 30.0–36.0)
MCV: 85.7 fL (ref 78.0–100.0)
MONO ABS: 1.4 10*3/uL — AB (ref 0.1–1.0)
Monocytes Relative: 10 %
NEUTROS ABS: 10.2 10*3/uL — AB (ref 1.7–7.7)
Neutrophils Relative %: 76 %
Platelets: 228 10*3/uL (ref 150–400)
RBC: 4.88 MIL/uL (ref 4.22–5.81)
RDW: 12.8 % (ref 11.5–15.5)
WBC: 13.4 10*3/uL — ABNORMAL HIGH (ref 4.0–10.5)

## 2015-08-09 LAB — COMPREHENSIVE METABOLIC PANEL
ALBUMIN: 3.8 g/dL (ref 3.5–5.0)
ALT: 22 U/L (ref 17–63)
ANION GAP: 8 (ref 5–15)
AST: 15 U/L (ref 15–41)
Alkaline Phosphatase: 74 U/L (ref 38–126)
BILIRUBIN TOTAL: 0.7 mg/dL (ref 0.3–1.2)
BUN: 12 mg/dL (ref 6–20)
CHLORIDE: 105 mmol/L (ref 101–111)
CO2: 22 mmol/L (ref 22–32)
Calcium: 9.2 mg/dL (ref 8.9–10.3)
Creatinine, Ser: 1.27 mg/dL — ABNORMAL HIGH (ref 0.61–1.24)
GFR calc Af Amer: >60 mL/min (ref >60)
GFR calc non Af Amer: >60 mL/min (ref >60)
GLUCOSE: 127 mg/dL — AB (ref 65–99)
POTASSIUM: 3.6 mmol/L (ref 3.5–5.1)
SODIUM: 135 mmol/L (ref 135–145)
TOTAL PROTEIN: 6.7 g/dL (ref 6.5–8.1)

## 2015-08-09 LAB — URINALYSIS, ROUTINE W REFLEX MICROSCOPIC
BILIRUBIN URINE: NEGATIVE
GLUCOSE, UA: NEGATIVE mg/dL
HGB URINE DIPSTICK: NEGATIVE
Ketones, ur: NEGATIVE mg/dL
Leukocytes, UA: NEGATIVE
Nitrite: NEGATIVE
PROTEIN: NEGATIVE mg/dL
Specific Gravity, Urine: 1.01 (ref 1.005–1.030)
pH: 7 (ref 5.0–8.0)

## 2015-08-09 LAB — I-STAT CG4 LACTIC ACID, ED: LACTIC ACID, VENOUS: 0.85 mmol/L (ref 0.5–2.0)

## 2015-08-09 MED ORDER — CEFTRIAXONE SODIUM 1 G IJ SOLR
1.0000 g | Freq: Once | INTRAMUSCULAR | Status: AC
  Administered 2015-08-09: 1 g via INTRAVENOUS
  Filled 2015-08-09: qty 10

## 2015-08-09 MED ORDER — HYDROMORPHONE HCL 1 MG/ML IJ SOLN
1.0000 mg | INTRAMUSCULAR | Status: AC
  Administered 2015-08-09: 1 mg via INTRAVENOUS
  Filled 2015-08-09: qty 1

## 2015-08-09 MED ORDER — ONDANSETRON HCL 4 MG/2ML IJ SOLN
4.0000 mg | Freq: Once | INTRAMUSCULAR | Status: AC
  Administered 2015-08-09: 4 mg via INTRAVENOUS
  Filled 2015-08-09: qty 2

## 2015-08-09 MED ORDER — CEPHALEXIN 500 MG PO CAPS: 500.0000 mg | ORAL_CAPSULE | Freq: Four times a day (QID) | ORAL | Status: DC

## 2015-08-09 MED ORDER — SODIUM CHLORIDE 0.9 % IV BOLUS (SEPSIS)
1000.0000 mL | Freq: Once | INTRAVENOUS | Status: AC
  Administered 2015-08-09: 1000 mL via INTRAVENOUS

## 2015-08-09 MED ORDER — SULFAMETHOXAZOLE-TRIMETHOPRIM 800-160 MG PO TABS: 1.0000 | ORAL_TABLET | Freq: Two times a day (BID) | ORAL | Status: AC

## 2015-08-09 MED ORDER — HYDROMORPHONE HCL 1 MG/ML IJ SOLN
1.0000 mg | Freq: Once | INTRAMUSCULAR | Status: AC
  Administered 2015-08-09: 1 mg via INTRAVENOUS
  Filled 2015-08-09: qty 1

## 2015-08-09 MED ORDER — OXYCODONE-ACETAMINOPHEN 5-325 MG PO TABS: 1.0000 | ORAL_TABLET | ORAL | Status: DC | PRN

## 2015-08-09 MED ORDER — MORPHINE SULFATE (PF) 4 MG/ML IV SOLN
4.0000 mg | Freq: Once | INTRAVENOUS | Status: AC
  Administered 2015-08-09: 4 mg via INTRAVENOUS
  Filled 2015-08-09: qty 1

## 2015-08-09 NOTE — ED Notes (Signed)
Patient inquiring if it is okay for him to take regular home meds, including immunosuppressants, Dr. Gilford Raid made aware and advised this RN pt may take all regular meds except daily aspirin.

## 2015-08-09 NOTE — ED Notes (Signed)
MD at bedside. 

## 2015-08-09 NOTE — ED Notes (Signed)
Pt reports severe pain and redness in his tailbone area that began yesterday. Pt reports hx of kidney transplant, denies urinary symptoms, fever or chills.

## 2015-08-09 NOTE — ED Provider Notes (Signed)
CSN: [id number]     Arrival date & time 08/09/15  0731 History   First MD Initiated Contact with Patient 08/09/15 [phone]     Chief Complaint  Patient presents with  . Tailbone Pain   PT IS A 35 YOU WM WITH A HX OF ULCERATIVE COLITIS AND CRI S/P RENAL TRANSPLANT.  PT SAID THAT HE'S HAD PAIN IN HIS PERINEUM FOR THE PAST FEW DAYS.  HE THOUGHT THAT HE MAY HAVE A HEMORRHOID, SO HE DID NOT THINK ANYTHING OF IT.  THE PAIN HAS BEEN PROGRESSING AND NOW IT HURTS A LOT TO MOVE.  PT C/O NAUSEA, BUT NO FEVER.    (Consider location/radiation/quality/duration/timing/severity/associated sxs/prior Treatment) The history is provided by the patient.    Past Medical History  Diagnosis Date  . Ulcerative colitis ([place])   . Hypertension   . Chronic renal failure     post kidney transplantation  . GERD (gastroesophageal reflux disease)   . Anal fissure   . Internal hemorrhoids   . Pancreatitis    Past Surgical History  Procedure Laterality Date  . Anterior cruciate ligament repair Left   . Knee cartilage surgery Left     x2  . Meniscus repair Left   . Appendectomy    . Kidney transplant  2008   Family History  Problem Relation Age of Onset  . Kidney cancer Maternal Grandfather   . Diabetes Paternal Grandmother   . Hyperthyroidism Father    Social History  Substance Use Topics  . Smoking status: Never Smoker   . Smokeless tobacco: Former Systems developer    Types: Chew    Quit date: 02/20/2005  . Alcohol Use: 0.0 oz/week    0 Standard drinks or equivalent per week     Comment: occasional    Review of Systems  Gastrointestinal: Positive for nausea and rectal pain.  All other systems reviewed and are negative.     Allergies  Mercaptopurine and Mesalamine  Home Medications   Prior to Admission medications   Medication Sig Start Date End Date Taking? Authorizing Provider  amLODipine (NORVASC) 10 MG tablet Take 1 tablet by mouth daily. 06/10/14  Yes Historical Provider, MD  aspirin EC 81 MG  tablet Take 81 mg by mouth daily.   Yes Historical Provider, MD  LORazepam (ATIVAN) 0.5 MG tablet Take 0.5 mg by mouth 2 (two) times daily as needed for anxiety.   Yes Historical Provider, MD  MYFORTIC 360 MG TBEC EC tablet Take 2 tablets by mouth 2 (two) times daily. 08/12/14  Yes Historical Provider, MD  pantoprazole (PROTONIX) 40 MG tablet Take 1 tablet by mouth daily. 06/10/14  Yes Historical Provider, MD  predniSONE (DELTASONE) 5 MG tablet Take 1 tablet by mouth daily. 06/10/14  Yes Historical Provider, MD  tacrolimus (PROGRAF) 1 MG capsule Take 3 capsules by mouth 2 (two) times daily. 08/12/14  Yes Historical Provider, MD  cephALEXin (KEFLEX) 500 MG capsule Take 1 capsule (500 mg total) by mouth 4 (four) times daily. 08/09/15   Isla Pence, MD  oxyCODONE-acetaminophen (PERCOCET/ROXICET) 5-325 MG tablet Take 1 tablet by mouth every 4 (four) hours as needed for severe pain. 08/09/15   Isla Pence, MD  sulfamethoxazole-trimethoprim (BACTRIM DS,SEPTRA DS) 800-160 MG tablet Take 1 tablet by mouth 2 (two) times daily. 08/09/15 08/16/15  Isla Pence, MD   BP 130/77 mmHg  Pulse 84  Temp(Src) 98.5 F (36.9 C) (Oral)  Resp 20  SpO2 95% Physical Exam  Constitutional: He is oriented to person, place, and  time. He appears well-developed and well-nourished.  HENT:  Head: Normocephalic and atraumatic.  Right Ear: External ear normal.  Left Ear: External ear normal.  Nose: Nose normal.  Mouth/Throat: Oropharynx is clear and moist.  Eyes: Conjunctivae and EOM are normal. Pupils are equal, round, and reactive to light.  Neck: Normal range of motion. Neck supple.  Cardiovascular: Normal rate, regular rhythm, normal heart sounds and intact distal pulses.   Pulmonary/Chest: Effort normal and breath sounds normal.  Abdominal: Soft. Bowel sounds are normal.  Genitourinary:  SLIGHT REDNESS TO PERINEUM.  NO FLUCTUANT AREAS.  Musculoskeletal: Normal range of motion.  Neurological: He is alert and  oriented to person, place, and time.  Skin: Skin is warm and dry.  Psychiatric: He has a normal mood and affect. His behavior is normal. Judgment and thought content normal.  Nursing note and vitals reviewed.   ED Course  Procedures (including critical care time) Labs Review Labs Reviewed  COMPREHENSIVE METABOLIC PANEL - Abnormal; Notable for the following:    Glucose, Bld 127 (*)    Creatinine, Ser 1.27 (*)    All other components within normal limits  CBC WITH DIFFERENTIAL/PLATELET - Abnormal; Notable for the following:    WBC 13.4 (*)    Neutro Abs 10.2 (*)    Monocytes Absolute 1.4 (*)    All other components within normal limits  CULTURE, BLOOD (ROUTINE X 2)  CULTURE, BLOOD (ROUTINE X 2)  URINALYSIS, ROUTINE W REFLEX MICROSCOPIC (NOT AT [facility])  I-STAT CG4 LACTIC ACID, ED    Imaging Review Ct Abdomen Pelvis Wo Contrast  08/09/2015  CLINICAL DATA:  Rectal pain. History of hemorrhoids. History of ulcerative colitis. Prior renal transplant. EXAM: CT ABDOMEN AND PELVIS WITHOUT CONTRAST TECHNIQUE: Multidetector CT imaging of the abdomen and pelvis was performed following the standard protocol without IV contrast. COMPARISON:  CT 09/08/2004 FINDINGS: Lower chest: Lung bases are clear. No effusions. Heart is normal size. Hepatobiliary: Diffuse fatty infiltration of the liver. No visible focal abnormality. Gallbladder unremarkable. Pancreas: No focal abnormality or ductal dilatation. Spleen: No focal abnormality.  Normal size. Adrenals/Urinary Tract: Kidneys are atrophic. Small cyst off the midpole of the right kidney. Punctate nonobstructing stone in the mid to lower pole of the right kidney. No hydronephrosis. Adrenal glands and urinary bladder grossly unremarkable. Right lower quadrant renal transplant noted, within unremarkable unenhanced appearance. Stomach/Bowel: Stomach, large and small bowel grossly unremarkable. Vascular/Lymphatic: No evidence of aneurysm or adenopathy. Reproductive:  No visible abnormality Other: No free fluid or free air. There is stranding within the perirectal soft tissues, best seen on coronal imaging. No well-defined drainable fluid collection. Musculoskeletal: No acute bony abnormality or focal bone lesion. IMPRESSION: Stranding noted posterior to the rectum without drainable fluid collection. This could reflect developing perirectal abscess. Diffuse fatty infiltration of the liver. Atrophic native kidneys with right lower quadrant transplant kidney. Electronically Signed   By: Rolm Baptise M.D.   On: 08/09/2015 10:11   I have personally reviewed and evaluated these images and lab results as part of my medical decision-making.   EKG Interpretation None      MDM  PT GIVEN A DOSE OF ROCEPHIN HERE IN THE ED.  HE IS GIVEN A RX FOR BACTRIM AND KEFLEX.  PT TOLD TO RETURN IN 24 HRS IF NO IMPROVEMENT.  HE IS ALSO TOLD TO F/U WITH HIS KIDNEY TRANSPLANT DR. Final diagnoses:  Perirectal cellulitis       Isla Pence, MD 08/09/15 [phone]

## 2015-08-14 LAB — CULTURE, BLOOD (ROUTINE X 2)
CULTURE: NO GROWTH
CULTURE: NO GROWTH

## 2016-02-08 ENCOUNTER — Encounter: Payer: Self-pay | Admitting: Emergency Medicine

## 2016-02-08 ENCOUNTER — Emergency Department
Admission: EM | Admit: 2016-02-08 | Discharge: 2016-02-08 | Disposition: A | Discharge status: Home / Self Care | Payer: 59 | Attending: Emergency Medicine | Admitting: Emergency Medicine

## 2016-02-08 DIAGNOSIS — S81811A Laceration without foreign body, right lower leg, initial encounter: Secondary | ICD-10-CM | POA: Insufficient documentation

## 2016-02-08 DIAGNOSIS — Z23 Encounter for immunization: Secondary | ICD-10-CM | POA: Insufficient documentation

## 2016-02-08 DIAGNOSIS — Y999 Unspecified external cause status: Secondary | ICD-10-CM | POA: Insufficient documentation

## 2016-02-08 DIAGNOSIS — Y9389 Activity, other specified: Secondary | ICD-10-CM | POA: Insufficient documentation

## 2016-02-08 DIAGNOSIS — Z79899 Other long term (current) drug therapy: Secondary | ICD-10-CM | POA: Insufficient documentation

## 2016-02-08 DIAGNOSIS — Z940 Kidney transplant status: Secondary | ICD-10-CM | POA: Diagnosis not present

## 2016-02-08 DIAGNOSIS — Z7982 Long term (current) use of aspirin: Secondary | ICD-10-CM | POA: Diagnosis not present

## 2016-02-08 DIAGNOSIS — I129 Hypertensive chronic kidney disease with stage 1 through stage 4 chronic kidney disease, or unspecified chronic kidney disease: Secondary | ICD-10-CM | POA: Insufficient documentation

## 2016-02-08 DIAGNOSIS — Y92007 Garden or yard of unspecified non-institutional (private) residence as the place of occurrence of the external cause: Secondary | ICD-10-CM | POA: Insufficient documentation

## 2016-02-08 DIAGNOSIS — W268XXA Contact with other sharp object(s), not elsewhere classified, initial encounter: Secondary | ICD-10-CM | POA: Insufficient documentation

## 2016-02-08 DIAGNOSIS — Z87891 Personal history of nicotine dependence: Secondary | ICD-10-CM | POA: Diagnosis not present

## 2016-02-08 DIAGNOSIS — N184 Chronic kidney disease, stage 4 (severe): Secondary | ICD-10-CM | POA: Insufficient documentation

## 2016-02-08 MED ORDER — CEPHALEXIN 500 MG PO CAPS: 500.0000 mg | ORAL_CAPSULE | Freq: Three times a day (TID) | ORAL | 0 refills | Status: DC

## 2016-02-08 MED ORDER — TETANUS-DIPHTH-ACELL PERTUSSIS 5-2.5-18.5 LF-MCG/0.5 IM SUSP
0.5000 mL | Freq: Once | INTRAMUSCULAR | Status: AC
  Administered 2016-02-08: 0.5 mL via INTRAMUSCULAR
  Filled 2016-02-08: qty 0.5

## 2016-02-08 MED ORDER — LIDOCAINE-EPINEPHRINE (PF) 2 %-1:200000 IJ SOLN
30.0000 mL | Freq: Once | INTRAMUSCULAR | Status: DC
  Filled 2016-02-08: qty 30

## 2016-02-08 MED ORDER — LIDOCAINE-EPINEPHRINE 2 %-1:100000 IJ SOLN
30.0000 mL | Freq: Once | INTRAMUSCULAR | Status: DC
  Filled 2016-02-08: qty 30

## 2016-02-08 NOTE — Discharge Instructions (Signed)
Keep the wound clean, dry, and covered. Follow-up with your provider in 10-14 days for suture removal.

## 2016-02-08 NOTE — ED Provider Notes (Signed)
[facility] Emergency Department Provider Note ____________________________________________  Time seen: 1514  I have reviewed the triage vital signs and the nursing notes.  HISTORY  Chief Complaint  Extremity Laceration  HPI Joe [name] is a 35 y.o. male presents to the ED for evaluation of laceration sustainedto his right leg. He describes moving a large 55-gallon drum across the yard today. He previously shot holes in the drum to allow it to be used for burning yard debris. He describes that the rough edges of one of the holes, grazed across his right calf as he slung the barrel across the yard. He sustained a laceration to the lateral right calf. He is unsure of his current tetanus status. And is a kidney transplant recipient.  Past Medical History:  Diagnosis Date  . Anal fissure   . Chronic renal failure    post kidney transplantation  . GERD (gastroesophageal reflux disease)   . Hypertension   . Internal hemorrhoids   . Pancreatitis   . Ulcerative colitis [facility])     Patient Active Problem List   Diagnosis Date Noted  . GERD 07/28/2008  . ANAL FISSURE 02/20/2008  . HYPERTENSION 04/16/2007  . ULCERATIVE COLITIS, UNIVERSAL 04/16/2007  . CHRONIC KIDNEY DISEASE STAGE IV (SEVERE) 04/16/2007    Past Surgical History:  Procedure Laterality Date  . ANTERIOR CRUCIATE LIGAMENT REPAIR Left   . APPENDECTOMY    . KIDNEY TRANSPLANT  2008  . KNEE CARTILAGE SURGERY Left    x2  . MENISCUS REPAIR Left     Prior to Admission medications   Medication Sig Start Date End Date Taking? Authorizing Provider  amLODipine (NORVASC) 10 MG tablet Take 1 tablet by mouth daily. 06/10/14   Historical Provider, MD  aspirin EC 81 MG tablet Take 81 mg by mouth daily.    Historical Provider, MD  cephALEXin (KEFLEX) 500 MG capsule Take 1 capsule (500 mg total) by mouth 4 (four) times daily. 08/09/15   Isla Pence, MD  LORazepam (ATIVAN) 0.5 MG tablet Take 0.5 mg by  mouth 2 (two) times daily as needed for anxiety.    Historical Provider, MD  MYFORTIC 360 MG TBEC EC tablet Take 2 tablets by mouth 2 (two) times daily. 08/12/14   Historical Provider, MD  oxyCODONE-acetaminophen (PERCOCET/ROXICET) 5-325 MG tablet Take 1 tablet by mouth every 4 (four) hours as needed for severe pain. 08/09/15   Isla Pence, MD  pantoprazole (PROTONIX) 40 MG tablet Take 1 tablet by mouth daily. 06/10/14   Historical Provider, MD  predniSONE (DELTASONE) 5 MG tablet Take 1 tablet by mouth daily. 06/10/14   Historical Provider, MD  tacrolimus (PROGRAF) 1 MG capsule Take 3 capsules by mouth 2 (two) times daily. 08/12/14   Historical Provider, MD   Allergies Mercaptopurine and Mesalamine  Family History  Problem Relation Age of Onset  . Kidney cancer Maternal Grandfather   . Diabetes Paternal Grandmother   . Hyperthyroidism Father     Social History Social History  Substance Use Topics  . Smoking status: Never Smoker  . Smokeless tobacco: Former Systems developer    Types: Chew    Quit date: 02/20/2005  . Alcohol use 0.0 oz/week     Comment: occasional   Review of Systems  Constitutional: Negative for fever. Cardiovascular: Negative for chest pain. Respiratory: Negative for shortness of breath. Musculoskeletal: Negative for back pain. Skin: Negative for rash. Leg lac as above.  Neurological: Negative for headaches, focal weakness or numbness. ____________________________________________  PHYSICAL EXAM:  VITAL SIGNS: ED Triage Vitals  Enc Vitals Group     BP 02/08/16 1430 (!) 158/87     Pulse Rate 02/08/16 1430 99     Resp 02/08/16 1430 20     Temp 02/08/16 1430 98.2 F (36.8 C)     Temp Source 02/08/16 1430 Oral     SpO2 02/08/16 1430 99 %     Weight 02/08/16 1425 279 lb (126.6 kg)     Height --      Head Circumference --      Peak Flow --      Pain Score --      Pain Loc --      Pain Edu? --      Excl. in [place]? --     Constitutional: Alert and oriented. Well  appearing and in no distress. Head: Normocephalic and atraumatic. Cardiovascular: Normal distal pulses. Respiratory: Normal respiratory effort.  Musculoskeletal: Nontender with normal range of motion in all extremities.  Neurologic:  Normal gait without ataxia. Normal speech and language. No gross focal neurologic deficits are appreciated. Skin:  Skin is warm, dry and intact. No rash noted. Lateral right calf with a stellate laceration with a "Y" appearance. No active bleeding noted.  ____________________________________________  PROCEDURES  Tdap 0.5 ml IM  LACERATION REPAIR Performed by: Melvenia Needles Authorized by: Melvenia Needles Consent: Verbal consent obtained. Risks and benefits: risks, benefits and alternatives were discussed Consent given by: patient Patient identity confirmed: provided demographic data Prepped and Draped in normal sterile fashion Wound explored  Laceration Location: right calf  Laceration Length: 3 cm stellate  No Foreign Bodies seen or palpated  Anesthesia: local infiltration  Local anesthetic: lidocaine 2% w/ epinephrine  Anesthetic total: 5 ml  Irrigation method: syringe Amount of cleaning: standard  Skin closure: 4-0 nylon  Number of sutures: 10  Technique: interrupted  Patient tolerance: Patient tolerated the procedure well with no immediate complications. ____________________________________________  INITIAL IMPRESSION / ASSESSMENT AND PLAN / ED COURSE  Patient with a right leg laceration status post suture repair. He will follow up with his primary care provider in 10-14 days for suture removal. Wound care instructions are provided.  Clinical Course    ____________________________________________  FINAL CLINICAL IMPRESSION(S) / ED DIAGNOSES  Final diagnoses:  Laceration of right lower extremity, initial encounter     Melvenia Needles, PA-C 02/08/16 1647    Delman Kitten, MD 02/08/16 2030

## 2016-02-21 HISTORY — PX: ANUS SURGERY: SHX302

## 2016-02-21 HISTORY — PX: KNEE SURGERY: SHX244

## 2016-07-05 ENCOUNTER — Ambulatory Visit
Admission: RE | Admit: 2016-07-05 | Discharge: 2016-07-05 | Disposition: A | Discharge status: Home / Self Care | Payer: Worker's Compensation | Source: Ambulatory Visit | Attending: Nurse Practitioner | Admitting: Nurse Practitioner

## 2016-07-05 ENCOUNTER — Other Ambulatory Visit: Payer: Self-pay | Admitting: Nurse Practitioner

## 2016-07-05 DIAGNOSIS — M25561 Pain in right knee: Secondary | ICD-10-CM

## 2016-11-29 ENCOUNTER — Ambulatory Visit (HOSPITAL_COMMUNITY)
Admission: EM | Admit: 2016-11-29 | Discharge: 2016-11-29 | Disposition: A | Discharge status: Home / Self Care | Payer: 59 | Attending: Family Medicine | Admitting: Family Medicine

## 2016-11-29 ENCOUNTER — Encounter (HOSPITAL_COMMUNITY): Payer: Self-pay | Admitting: Emergency Medicine

## 2016-11-29 DIAGNOSIS — R109 Unspecified abdominal pain: Secondary | ICD-10-CM

## 2016-11-29 LAB — POCT URINALYSIS DIP (DEVICE)
BILIRUBIN URINE: NEGATIVE
Glucose, UA: NEGATIVE mg/dL
HGB URINE DIPSTICK: NEGATIVE
KETONES UR: NEGATIVE mg/dL
LEUKOCYTES UA: NEGATIVE
NITRITE: NEGATIVE
PH: 6 (ref 5.0–8.0)
Protein, ur: NEGATIVE mg/dL
Specific Gravity, Urine: 1.015 (ref 1.005–1.030)
Urobilinogen, UA: 0.2 mg/dL (ref 0.0–1.0)

## 2016-11-29 NOTE — ED Triage Notes (Signed)
PT reports right sided flank pain for 5 days. PT reports he had a right kidney transplant 10 years ago for interstitial nephritis. PT reports he also feels "foggy" mentally.

## 2016-11-29 NOTE — ED Provider Notes (Signed)
[place]    CSN: [id number] Arrival date & time: 11/29/16  0762     History   Chief Complaint Chief Complaint  Patient presents with  . Flank Pain    HPI Joe [name] is a 36 y.o. male.   Patient is a 7 yo M with h/o kidney transplant 10 years ago who presents to urgent care with c/o R flank pain.  Started 5 days ago R flank pain radiating to front, feels like a pressure, constant, has been steadily worsening. Exacerbated by positional changes. Feels some fatigue and had some mild nausea today. No urinary symptoms. No dysuria. Normal urine output, color, odor. Has been eating and drinking well. Is most concerned about his kidney so called his nephrologist and is waiting for a call back with recommendations. Feels different than MSK pain he has had in the past. No fever/chills, rash. No sick contacts. No diarrhea or constipation, no pain with stooling.        Past Medical History:  Diagnosis Date  . Anal fissure   . Chronic renal failure    post kidney transplantation  . GERD (gastroesophageal reflux disease)   . Hypertension   . Internal hemorrhoids   . Pancreatitis   . Ulcerative colitis [facility])     Patient Active Problem List   Diagnosis Date Noted  . GERD 07/28/2008  . ANAL FISSURE 02/20/2008  . HYPERTENSION 04/16/2007  . ULCERATIVE COLITIS, UNIVERSAL 04/16/2007  . CHRONIC KIDNEY DISEASE STAGE IV (SEVERE) 04/16/2007    Past Surgical History:  Procedure Laterality Date  . ANTERIOR CRUCIATE LIGAMENT REPAIR Left   . APPENDECTOMY    . KIDNEY TRANSPLANT  2008  . KNEE CARTILAGE SURGERY Left    x2  . MENISCUS REPAIR Left        Home Medications    Prior to Admission medications   Medication Sig Start Date End Date Taking? Authorizing Provider  amLODipine (NORVASC) 10 MG tablet Take 1 tablet by mouth daily. 06/10/14  Yes [provider]  aspirin EC 81 MG tablet Take 81 mg by mouth daily.   Yes [provider]    LORazepam (ATIVAN) 0.5 MG tablet Take 0.5 mg by mouth 2 (two) times daily as needed for anxiety.   Yes [provider]  MYFORTIC 360 MG TBEC EC tablet Take 2 tablets by mouth 2 (two) times daily. 08/12/14  Yes [provider]  pantoprazole (PROTONIX) 40 MG tablet Take 1 tablet by mouth daily. 06/10/14  Yes [provider]  predniSONE (DELTASONE) 5 MG tablet Take 1 tablet by mouth daily. 06/10/14  Yes [provider]  tacrolimus (PROGRAF) 1 MG capsule Take 3 capsules by mouth 2 (two) times daily. 08/12/14  Yes [provider]  cephALEXin (KEFLEX) 500 MG capsule Take 1 capsule (500 mg total) by mouth 3 (three) times daily. 02/08/16   Menshew, Dannielle Karvonen, PA-C  oxyCODONE-acetaminophen (PERCOCET/ROXICET) 5-325 MG tablet Take 1 tablet by mouth every 4 (four) hours as needed for severe pain. 08/09/15   Isla Pence, MD    Family History Family History  Problem Relation Age of Onset  . Kidney cancer Maternal Grandfather   . Diabetes Paternal Grandmother   . Hyperthyroidism Father     Social History Social History  Substance Use Topics  . Smoking status: Never Smoker  . Smokeless tobacco: Former Systems developer    Types: Chew    Quit date: 02/20/2005  . Alcohol use 0.0 oz/week     Comment:  occasional     Allergies   Mercaptopurine and Mesalamine   Review of Systems Review of Systems  Constitutional: Positive for fatigue. Negative for appetite change, chills, diaphoresis and fever.  Respiratory: Negative for chest tightness, shortness of breath and wheezing.   Cardiovascular: Negative for chest pain and palpitations.  Gastrointestinal: Positive for nausea. Negative for abdominal distention, abdominal pain, constipation, diarrhea and vomiting.  Genitourinary: Positive for flank pain. Negative for decreased urine volume, difficulty urinating, dysuria, frequency, hematuria and urgency.  Musculoskeletal: Negative for arthralgias, back pain and myalgias.   Skin: Negative for rash.  Neurological: Negative for weakness.     Physical Exam Triage Vital Signs ED Triage Vitals  Enc Vitals Group     BP 11/29/16 1013 (!) 155/104     Pulse Rate 11/29/16 1013 76     Resp 11/29/16 1013 16     Temp 11/29/16 1013 98.2 F (36.8 C)     Temp Source 11/29/16 1013 Oral     SpO2 11/29/16 1013 98 %     Weight 11/29/16 1011 280 lb (127 kg)     Height 11/29/16 1011 6\' 3"  (1.905 m)     Head Circumference --      Peak Flow --      Pain Score 11/29/16 1011 5     Pain Loc --      Pain Edu? --      Excl. in [place]? --    No data found.   Updated Vital Signs BP 130/86   Pulse 76   Temp 98.2 F (36.8 C) (Oral)   Resp 16   Ht 6\' 3"  (1.905 m)   Wt 280 lb (127 kg)   SpO2 98%   BMI 35.00 kg/m   Physical Exam  Constitutional: He is oriented to person, place, and time. He appears well-developed and well-nourished. No distress.  HENT:  Head: Normocephalic and atraumatic.  Right Ear: External ear normal.  Left Ear: External ear normal.  Nose: Nose normal.  Eyes: Conjunctivae and EOM are normal.  Neck: Normal range of motion.  Cardiovascular: Normal rate, regular rhythm and normal heart sounds.   No murmur heard. Pulmonary/Chest: Effort normal and breath sounds normal. No respiratory distress. He has no wheezes.  Abdominal: Soft. Bowel sounds are normal. He exhibits no distension. There is no tenderness. There is no rebound and no guarding.  No CVA tenderness  Musculoskeletal: Normal range of motion. He exhibits no edema.       Arms: Neurological: He is alert and oriented to person, place, and time. He exhibits normal muscle tone.  Skin: Skin is warm and dry. Capillary refill takes less than 2 seconds. No rash noted. He is not diaphoretic. No erythema.  Psychiatric: He has a normal mood and affect.     UC Treatments / Results  Labs (all labs ordered are listed, but only abnormal results are displayed) Labs Reviewed  POCT URINALYSIS DIP  (DEVICE)    EKG  EKG Interpretation None       Radiology No results found.  Procedures Procedures (including critical care time)  Medications Ordered in UC Medications - No data to display   Initial Impression / Assessment and Plan / UC Course  I have reviewed the triage vital signs and the nursing notes.  Pertinent labs & imaging results that were available during my care of the patient were reviewed by me and considered in my medical decision making (see chart for details).   Patient is a 36yo  M with PMH of kidney transplant who presented to urgent care with R flank pain. Uncertain etiology, most likely is MSK related pain given point tenderness to R paraspinal musculature. DDx is quite broad. Unlikely to be infection given patient is well appearing and afebrile and UA without leukocytes or nitrites. Also unlikely is intraabdominal process given benign abdominal exam. No rash and has been 5 days so would think that if prodrome of shingles then would have developed more symptoms at this point. Discussed all of this at length with patient, recommended that he await phone call from his nephrologist and make an appointment with PCP for recheck. Discussed return precautions vs reasons to go to ER, patient voiced good understanding.  Final Clinical Impressions(s) / UC Diagnoses   Final diagnoses:  Right flank pain    New Prescriptions Discharge Medication List as of 11/29/2016 11:02 AM       Bufford Lope, DO 11/29/16 1116

## 2016-11-29 NOTE — Discharge Instructions (Signed)
We talked about some possible causes of your flank pain including infection but no fever, bowel related but your belly exam is good, viral but no rash although you do have some fatigue and mild nausea. If your symptoms worsen significantly please head to the ER. I am glad you have called your nephrologist for recommendations. You can follow up with your PCP for recheck or bloodwork as they see fit.

## 2017-03-22 DIAGNOSIS — J020 Streptococcal pharyngitis: Secondary | ICD-10-CM | POA: Diagnosis not present

## 2017-04-05 DIAGNOSIS — M1712 Unilateral primary osteoarthritis, left knee: Secondary | ICD-10-CM | POA: Diagnosis not present

## 2017-04-13 DIAGNOSIS — Z4822 Encounter for aftercare following kidney transplant: Secondary | ICD-10-CM | POA: Diagnosis not present

## 2017-04-13 DIAGNOSIS — Z940 Kidney transplant status: Secondary | ICD-10-CM | POA: Diagnosis not present

## 2017-04-13 DIAGNOSIS — I10 Essential (primary) hypertension: Secondary | ICD-10-CM | POA: Diagnosis not present

## 2017-04-13 DIAGNOSIS — D899 Disorder involving the immune mechanism, unspecified: Secondary | ICD-10-CM | POA: Diagnosis not present

## 2017-05-03 DIAGNOSIS — M1712 Unilateral primary osteoarthritis, left knee: Secondary | ICD-10-CM | POA: Diagnosis not present

## 2017-05-07 DIAGNOSIS — M1712 Unilateral primary osteoarthritis, left knee: Secondary | ICD-10-CM | POA: Diagnosis not present

## 2017-05-21 DIAGNOSIS — M1712 Unilateral primary osteoarthritis, left knee: Secondary | ICD-10-CM | POA: Diagnosis not present

## 2017-06-22 DIAGNOSIS — D229 Melanocytic nevi, unspecified: Secondary | ICD-10-CM | POA: Diagnosis not present

## 2017-06-22 DIAGNOSIS — I781 Nevus, non-neoplastic: Secondary | ICD-10-CM | POA: Diagnosis not present

## 2017-06-22 DIAGNOSIS — C44329 Squamous cell carcinoma of skin of other parts of face: Secondary | ICD-10-CM | POA: Diagnosis not present

## 2017-06-22 DIAGNOSIS — L821 Other seborrheic keratosis: Secondary | ICD-10-CM | POA: Diagnosis not present

## 2017-06-22 DIAGNOSIS — D1801 Hemangioma of skin and subcutaneous tissue: Secondary | ICD-10-CM | POA: Diagnosis not present

## 2017-07-03 DIAGNOSIS — D239 Other benign neoplasm of skin, unspecified: Secondary | ICD-10-CM | POA: Diagnosis not present

## 2017-07-03 DIAGNOSIS — C44329 Squamous cell carcinoma of skin of other parts of face: Secondary | ICD-10-CM | POA: Diagnosis not present

## 2017-07-03 DIAGNOSIS — D485 Neoplasm of uncertain behavior of skin: Secondary | ICD-10-CM | POA: Diagnosis not present

## 2017-07-03 DIAGNOSIS — D1721 Benign lipomatous neoplasm of skin and subcutaneous tissue of right arm: Secondary | ICD-10-CM | POA: Diagnosis not present

## 2017-09-10 DIAGNOSIS — M1712 Unilateral primary osteoarthritis, left knee: Secondary | ICD-10-CM | POA: Diagnosis not present

## 2017-09-27 ENCOUNTER — Encounter: Payer: Self-pay | Admitting: Internal Medicine

## 2017-09-28 DIAGNOSIS — L821 Other seborrheic keratosis: Secondary | ICD-10-CM | POA: Diagnosis not present

## 2017-09-28 DIAGNOSIS — Z940 Kidney transplant status: Secondary | ICD-10-CM | POA: Diagnosis not present

## 2017-09-28 DIAGNOSIS — Z4822 Encounter for aftercare following kidney transplant: Secondary | ICD-10-CM | POA: Diagnosis not present

## 2017-09-28 DIAGNOSIS — D899 Disorder involving the immune mechanism, unspecified: Secondary | ICD-10-CM | POA: Diagnosis not present

## 2017-09-28 DIAGNOSIS — I10 Essential (primary) hypertension: Secondary | ICD-10-CM | POA: Diagnosis not present

## 2017-09-28 DIAGNOSIS — L570 Actinic keratosis: Secondary | ICD-10-CM | POA: Diagnosis not present

## 2017-09-28 DIAGNOSIS — Z85828 Personal history of other malignant neoplasm of skin: Secondary | ICD-10-CM | POA: Diagnosis not present

## 2017-10-05 DIAGNOSIS — D899 Disorder involving the immune mechanism, unspecified: Secondary | ICD-10-CM | POA: Diagnosis not present

## 2017-10-05 DIAGNOSIS — Z940 Kidney transplant status: Secondary | ICD-10-CM | POA: Diagnosis not present

## 2017-10-05 DIAGNOSIS — M25562 Pain in left knee: Secondary | ICD-10-CM | POA: Diagnosis not present

## 2017-10-05 DIAGNOSIS — K610 Anal abscess: Secondary | ICD-10-CM | POA: Diagnosis not present

## 2017-10-05 DIAGNOSIS — M1712 Unilateral primary osteoarthritis, left knee: Secondary | ICD-10-CM | POA: Diagnosis not present

## 2017-10-08 DIAGNOSIS — K432 Incisional hernia without obstruction or gangrene: Secondary | ICD-10-CM | POA: Diagnosis not present

## 2017-10-26 DIAGNOSIS — L570 Actinic keratosis: Secondary | ICD-10-CM | POA: Diagnosis not present

## 2017-11-01 DIAGNOSIS — K439 Ventral hernia without obstruction or gangrene: Secondary | ICD-10-CM | POA: Diagnosis not present

## 2017-11-01 DIAGNOSIS — K430 Incisional hernia with obstruction, without gangrene: Secondary | ICD-10-CM | POA: Diagnosis not present

## 2017-11-01 DIAGNOSIS — K420 Umbilical hernia with obstruction, without gangrene: Secondary | ICD-10-CM | POA: Diagnosis not present

## 2017-11-01 DIAGNOSIS — E1122 Type 2 diabetes mellitus with diabetic chronic kidney disease: Secondary | ICD-10-CM | POA: Diagnosis not present

## 2017-11-20 ENCOUNTER — Encounter: Payer: Self-pay | Admitting: Internal Medicine

## 2017-11-21 DIAGNOSIS — Z48815 Encounter for surgical aftercare following surgery on the digestive system: Secondary | ICD-10-CM | POA: Diagnosis not present

## 2017-11-27 DIAGNOSIS — M1712 Unilateral primary osteoarthritis, left knee: Secondary | ICD-10-CM | POA: Diagnosis not present

## 2017-12-04 DIAGNOSIS — M1712 Unilateral primary osteoarthritis, left knee: Secondary | ICD-10-CM | POA: Diagnosis not present

## 2017-12-11 DIAGNOSIS — M1712 Unilateral primary osteoarthritis, left knee: Secondary | ICD-10-CM | POA: Diagnosis not present

## 2018-01-25 DIAGNOSIS — Z940 Kidney transplant status: Secondary | ICD-10-CM | POA: Diagnosis not present

## 2018-01-25 DIAGNOSIS — I10 Essential (primary) hypertension: Secondary | ICD-10-CM | POA: Diagnosis not present

## 2018-01-25 DIAGNOSIS — D899 Disorder involving the immune mechanism, unspecified: Secondary | ICD-10-CM | POA: Diagnosis not present

## 2018-02-05 DIAGNOSIS — D899 Disorder involving the immune mechanism, unspecified: Secondary | ICD-10-CM | POA: Diagnosis not present

## 2018-02-05 DIAGNOSIS — I151 Hypertension secondary to other renal disorders: Secondary | ICD-10-CM | POA: Diagnosis not present

## 2018-02-05 DIAGNOSIS — Z09 Encounter for follow-up examination after completed treatment for conditions other than malignant neoplasm: Secondary | ICD-10-CM | POA: Diagnosis not present

## 2018-02-05 DIAGNOSIS — Z940 Kidney transplant status: Secondary | ICD-10-CM | POA: Diagnosis not present

## 2018-02-15 DIAGNOSIS — H169 Unspecified keratitis: Secondary | ICD-10-CM | POA: Diagnosis not present

## 2018-02-18 DIAGNOSIS — H169 Unspecified keratitis: Secondary | ICD-10-CM | POA: Diagnosis not present

## 2018-02-25 DIAGNOSIS — H169 Unspecified keratitis: Secondary | ICD-10-CM | POA: Diagnosis not present

## 2018-03-18 ENCOUNTER — Other Ambulatory Visit: Payer: Self-pay

## 2018-03-18 ENCOUNTER — Ambulatory Visit (HOSPITAL_COMMUNITY)
Admission: EM | Admit: 2018-03-18 | Discharge: 2018-03-18 | Disposition: A | Discharge status: Home / Self Care | Payer: 59 | Attending: Family Medicine | Admitting: Family Medicine

## 2018-03-18 ENCOUNTER — Encounter (HOSPITAL_COMMUNITY): Payer: Self-pay | Admitting: Emergency Medicine

## 2018-03-18 DIAGNOSIS — J069 Acute upper respiratory infection, unspecified: Secondary | ICD-10-CM | POA: Insufficient documentation

## 2018-03-18 DIAGNOSIS — B9789 Other viral agents as the cause of diseases classified elsewhere: Secondary | ICD-10-CM

## 2018-03-18 MED ORDER — CEFDINIR 300 MG PO CAPS: 600.0000 mg | ORAL_CAPSULE | Freq: Every day | ORAL | 0 refills | Status: DC

## 2018-03-18 MED ORDER — OSELTAMIVIR PHOSPHATE 75 MG PO CAPS: 75.0000 mg | ORAL_CAPSULE | Freq: Two times a day (BID) | ORAL | 0 refills | Status: DC

## 2018-03-18 MED ORDER — HYDROCODONE-HOMATROPINE 5-1.5 MG/5ML PO SYRP: 5.0000 mL | ORAL_SOLUTION | Freq: Four times a day (QID) | ORAL | 0 refills | Status: DC | PRN

## 2018-03-18 NOTE — ED Provider Notes (Signed)
[place]    CSN: [id number] Arrival date & time: 03/18/18  0957     History   Chief Complaint Chief Complaint  Patient presents with  . URI    HPI Joe [name] is a 38 y.o. male.   Is a 38 year old man, renal transplant patient, who presents with cough, sore throat, nasal congestion and low grade fever no higher than 100.1 at home for the last three days.  Pt is a kidney transplant pt from [facility].  Although the symptoms began about 3 days ago, patient got a lot worse last night.  He has not aches, sore throat, and almost uncontrollable cough.  Patient works for the [employer].     Past Medical History:  Diagnosis Date  . Anal fissure   . Chronic renal failure    post kidney transplantation  . GERD (gastroesophageal reflux disease)   . Hypertension   . Internal hemorrhoids   . Pancreatitis   . Ulcerative colitis [facility])     Patient Active Problem List   Diagnosis Date Noted  . GERD 07/28/2008  . ANAL FISSURE 02/20/2008  . HYPERTENSION 04/16/2007  . ULCERATIVE COLITIS, UNIVERSAL 04/16/2007  . CHRONIC KIDNEY DISEASE STAGE IV (SEVERE) 04/16/2007    Past Surgical History:  Procedure Laterality Date  . ANTERIOR CRUCIATE LIGAMENT REPAIR Left   . APPENDECTOMY    . KIDNEY TRANSPLANT  2008  . KNEE CARTILAGE SURGERY Left    x2  . MENISCUS REPAIR Left        Home Medications    Prior to Admission medications   Medication Sig Start Date End Date Taking? Authorizing Provider  amLODipine (NORVASC) 10 MG tablet Take 1 tablet by mouth daily. 06/10/14  Yes [provider]  aspirin EC 81 MG tablet Take 81 mg by mouth daily.   Yes [provider]  LORazepam (ATIVAN) 0.5 MG tablet Take 0.5 mg by mouth 2 (two) times daily as needed for anxiety.   Yes [provider]  MYFORTIC 360 MG TBEC EC tablet Take 2 tablets by mouth 2 (two) times daily. 08/12/14  Yes [provider]  pantoprazole (PROTONIX) 40 MG tablet Take  1 tablet by mouth daily. 06/10/14  Yes [provider]  predniSONE (DELTASONE) 5 MG tablet Take 1 tablet by mouth daily. 06/10/14  Yes [provider]  tacrolimus (PROGRAF) 1 MG capsule Take 3 capsules by mouth 2 (two) times daily. 08/12/14  Yes [provider]  cephALEXin (KEFLEX) 500 MG capsule Take 1 capsule (500 mg total) by mouth 3 (three) times daily. 02/08/16   Menshew, Dannielle Karvonen, PA-C  oxyCODONE-acetaminophen (PERCOCET/ROXICET) 5-325 MG tablet Take 1 tablet by mouth every 4 (four) hours as needed for severe pain. 08/09/15   Isla Pence, MD    Family History Family History  Problem Relation Age of Onset  . Kidney cancer Maternal Grandfather   . Diabetes Paternal Grandmother   . Hyperthyroidism Father     Social History Social History   Tobacco Use  . Smoking status: Never Smoker  . Smokeless tobacco: Former Systems developer    Types: Chew  Substance Use Topics  . Alcohol use: Yes    Alcohol/week: 0.0 standard drinks    Comment: occasional  . Drug use: No     Allergies   Piperacillin-tazobactam in dex; Mercaptopurine; Mesalamine; Midazolam; and Mycophenolate mofetil   Review of Systems Review of Systems   Physical Exam Triage Vital Signs ED Triage Vitals  Enc Vitals Group  BP 03/18/18 1038 136/82     Pulse Rate 03/18/18 1038 82     Resp --      Temp 03/18/18 1038 99.3 F (37.4 C)     Temp Source 03/18/18 1038 Temporal     SpO2 03/18/18 1038 99 %     Weight --      Height --      Head Circumference --      Peak Flow --      Pain Score 03/18/18 1039 3     Pain Loc --      Pain Edu? --      Excl. in [place]? --    No data found.  Updated Vital Signs BP 136/82 (BP Location: Right Arm)   Pulse 82   Temp 99.3 F (37.4 C) (Temporal)   SpO2 99%    Physical Exam Constitutional:      General: He is not in acute distress.    Appearance: Normal appearance. He is ill-appearing. He is not toxic-appearing or diaphoretic.  HENT:      Head: Normocephalic and atraumatic.     Right Ear: Ear canal and external ear normal.     Left Ear: Tympanic membrane, ear canal and external ear normal.     Ears:     Comments: Patient has significant fluid behind his right TM with retraction    Nose: Congestion present.     Mouth/Throat:     Mouth: Mucous membranes are moist.     Pharynx: Oropharynx is clear. Posterior oropharyngeal erythema present. No oropharyngeal exudate.  Eyes:     Extraocular Movements: Extraocular movements intact.     Conjunctiva/sclera: Conjunctivae normal.     Pupils: Pupils are equal, round, and reactive to light.  Neck:     Musculoskeletal: Normal range of motion and neck supple.  Cardiovascular:     Rate and Rhythm: Normal rate and regular rhythm.     Heart sounds: Normal heart sounds.  Pulmonary:     Effort: Pulmonary effort is normal.     Breath sounds: Normal breath sounds.  Musculoskeletal: Normal range of motion.  Skin:    General: Skin is warm and dry.  Neurological:     General: No focal deficit present.     Mental Status: He is alert and oriented to person, place, and time.  Psychiatric:        Mood and Affect: Mood normal.        Thought Content: Thought content normal.      UC Treatments / Results  Labs (all labs ordered are listed, but only abnormal results are displayed) Labs Reviewed - No data to display  EKG None  Radiology No results found.  Procedures Procedures (including critical care time)  Medications Ordered in UC Medications - No data to display  Initial Impression / Assessment and Plan / UC Course  I have reviewed the triage vital signs and the nursing notes.  Pertinent labs & imaging results that were available during my care of the patient were reviewed by me and considered in my medical decision making (see chart for details).    Final Clinical Impressions(s) / UC Diagnoses   Final diagnoses:  None   Discharge Instructions   None    ED  Prescriptions    None     Controlled Substance Prescriptions [place] Controlled Substance Registry consulted? Not Applicable   Robyn Haber, MD 03/18/18 1104

## 2018-03-18 NOTE — ED Triage Notes (Signed)
Pt here for cough, sore throat, nasal congestion and low grade fever no higher than 100.1 at home for the last three days.  Pt is a kidney transplant pt from [facility].

## 2018-03-19 DIAGNOSIS — J101 Influenza due to other identified influenza virus with other respiratory manifestations: Secondary | ICD-10-CM | POA: Diagnosis not present

## 2018-03-19 DIAGNOSIS — R112 Nausea with vomiting, unspecified: Secondary | ICD-10-CM | POA: Diagnosis not present

## 2018-03-19 DIAGNOSIS — R509 Fever, unspecified: Secondary | ICD-10-CM | POA: Diagnosis not present

## 2018-04-30 DIAGNOSIS — M170 Bilateral primary osteoarthritis of knee: Secondary | ICD-10-CM | POA: Diagnosis not present

## 2018-06-27 DIAGNOSIS — M170 Bilateral primary osteoarthritis of knee: Secondary | ICD-10-CM | POA: Diagnosis not present

## 2018-07-04 DIAGNOSIS — M1712 Unilateral primary osteoarthritis, left knee: Secondary | ICD-10-CM | POA: Diagnosis not present

## 2018-07-10 DIAGNOSIS — M9905 Segmental and somatic dysfunction of pelvic region: Secondary | ICD-10-CM | POA: Diagnosis not present

## 2018-07-10 DIAGNOSIS — M5137 Other intervertebral disc degeneration, lumbosacral region: Secondary | ICD-10-CM | POA: Diagnosis not present

## 2018-07-10 DIAGNOSIS — M9903 Segmental and somatic dysfunction of lumbar region: Secondary | ICD-10-CM | POA: Diagnosis not present

## 2018-07-11 DIAGNOSIS — M5137 Other intervertebral disc degeneration, lumbosacral region: Secondary | ICD-10-CM | POA: Diagnosis not present

## 2018-07-11 DIAGNOSIS — M9905 Segmental and somatic dysfunction of pelvic region: Secondary | ICD-10-CM | POA: Diagnosis not present

## 2018-07-11 DIAGNOSIS — M9903 Segmental and somatic dysfunction of lumbar region: Secondary | ICD-10-CM | POA: Diagnosis not present

## 2018-09-02 ENCOUNTER — Encounter: Payer: Self-pay | Admitting: Internal Medicine

## 2018-09-18 ENCOUNTER — Ambulatory Visit: Payer: 59

## 2018-09-18 ENCOUNTER — Other Ambulatory Visit: Payer: Self-pay

## 2018-09-18 VITALS — Ht 75.0 in | Wt 256.0 lb

## 2018-09-18 DIAGNOSIS — K51019 Ulcerative (chronic) pancolitis with unspecified complications: Secondary | ICD-10-CM

## 2018-09-18 MED ORDER — SUPREP BOWEL PREP KIT 17.5-3.13-1.6 GM/177ML PO SOLN: 1.0000 | Freq: Once | ORAL | 0 refills | Status: AC

## 2018-09-18 NOTE — Progress Notes (Signed)
Per pt, no allergies to soy or egg products.Pt not taking any weight loss meds or using  O2 at home. Pt denies any sedation problems. Refused emmi video.   The PV was done over the phone due to COVID-19.Verified pt's address and insurance. Reviewed pt's medical hx and prep instructions and will mail paperwork to the pt today. nformed pt to call our office if he had any questions or changes prior to his procedure. Pt understood,

## 2018-10-01 ENCOUNTER — Telehealth: Payer: Self-pay | Admitting: Internal Medicine

## 2018-10-01 NOTE — Telephone Encounter (Signed)

## 2018-10-02 ENCOUNTER — Encounter: Payer: Self-pay | Admitting: Internal Medicine

## 2018-10-02 ENCOUNTER — Ambulatory Visit (AMBULATORY_SURGERY_CENTER): Payer: 59 | Admitting: Internal Medicine

## 2018-10-02 ENCOUNTER — Other Ambulatory Visit: Payer: Self-pay

## 2018-10-02 VITALS — BP 107/58 | HR 61 | Temp 98.9°F | Resp 11 | Ht 75.0 in | Wt 259.0 lb

## 2018-10-02 DIAGNOSIS — K529 Noninfective gastroenteritis and colitis, unspecified: Secondary | ICD-10-CM

## 2018-10-02 DIAGNOSIS — K51019 Ulcerative (chronic) pancolitis with unspecified complications: Secondary | ICD-10-CM | POA: Diagnosis not present

## 2018-10-02 DIAGNOSIS — K51518 Left sided colitis with other complication: Secondary | ICD-10-CM

## 2018-10-02 MED ORDER — SODIUM CHLORIDE 0.9 % IV SOLN: 500.0000 mL | Freq: Once | INTRAVENOUS | Status: DC

## 2018-10-02 NOTE — Progress Notes (Signed)
Called to room to assist during endoscopic procedure.  Patient ID and intended procedure confirmed with present staff. Received instructions for my participation in the procedure from the performing physician.  

## 2018-10-02 NOTE — Progress Notes (Signed)
To PACU, VSS. Report to [email or web address] 

## 2018-10-02 NOTE — Op Note (Signed)
[place] Patient Name: Joe [name] Procedure Date: 10/02/2018 1:01 PM MRN: [id number] Endoscopist: Docia Chuck. Henrene Pastor , MD Age: 38 Referring MD:  Date of Birth: 1980/04/18 Gender: Male Account #: [id number] Procedure:                Colonoscopy with biopsies Indications:              High risk colon cancer surveillance: Inflammatory                            bowel disease (unclassified) of 8 (or more) years                            duration with one-third (or more) of the colon                            involved. Initially diagnosed with universal                            ulcerative colitis 2000. Colonoscopy in 2010                            revealed mild right-sided disease. The patient is                            on multiple immunosuppressive agents for his kidney                            transplant. Since his last examination in 2016 he                            has required surgical treatment for a perirectal                            abscess and has a chronic fistula with intermittent                            drainage. It should be noted that on biopsies from                            his 2016 examination, multiple specimens revealed                            granulomatous supporting a diagnosis of Crohn's                            colitis. Medicines:                Monitored Anesthesia Care Procedure:                Pre-Anesthesia Assessment:                           - Prior to the procedure, a History and Physical  was performed, and patient medications and                            allergies were reviewed. The patient's tolerance of                            previous anesthesia was also reviewed. The risks                            and benefits of the procedure and the sedation                            options and risks were discussed with the patient.                            All questions were answered, and  informed consent                            was obtained. Prior Anticoagulants: The patient has                            taken no previous anticoagulant or antiplatelet                            agents. ASA Grade Assessment: III - A patient with                            severe systemic disease. After reviewing the risks                            and benefits, the patient was deemed in                            satisfactory condition to undergo the procedure.                           After obtaining informed consent, the colonoscope                            was passed under direct vision. Throughout the                            procedure, the patient's blood pressure, pulse, and                            oxygen saturations were monitored continuously. The                            Colonoscope was introduced through the anus and                            advanced to the the cecum, identified by  appendiceal orifice and ileocecal valve. The                            terminal ileum, ileocecal valve, appendiceal                            orifice, and rectum were photographed. The quality                            of the bowel preparation was excellent. The                            colonoscopy was performed without difficulty. The                            patient tolerated the procedure well. The bowel                            preparation used was SUPREP via split dose                            instruction. Scope In: 1:12:05 PM Scope Out: 1:30:34 PM Scope Withdrawal Time: 0 hours 16 minutes 8 seconds  Total Procedure Duration: 0 hours 18 minutes 29 seconds  Findings:                 The terminal ileum was intubated for short distance                            and appeared normal.                           The colon revealed subtle patchy erythema                            throughout without significant active inflammation,                             friability, ulcers or other abnormality. The colon                            appeared otherwise normal on direct and                            retroflexion views. Four biopsies were taken every                            10 cm with a cold forceps from the entire colon.                            These biopsy specimens were sent to Pathology. Complications:            No immediate complications. Estimated blood loss:  None. Estimated Blood Loss:     Estimated blood loss: none. Impression:               - The examined portion of the ileum was normal.                           - Mild patchy colonic erythema. Otherwise normal                            colonoscopy including retroflexed view.                           - Perirectal fistulous tract previously operated                           - Crohn's colitis with endoscopically and active                            disease Recommendation:           - Repeat colonoscopy in 3 years for surveillance if                            no dysplasia on biopsies.                           - Patient has a contact number available for                            emergencies. The signs and symptoms of potential                            delayed complications were discussed with the                            patient. Return to normal activities tomorrow.                            Written discharge instructions were provided to the                            patient.                           - Resume previous diet.                           - Continue present medications.                           - Await pathology results. Docia Chuck. Henrene Pastor, MD 10/02/2018 1:43:25 PM This report has been signed electronically.

## 2018-10-02 NOTE — Patient Instructions (Signed)
Discharge instructions given. Biopsies taken. Resume previous medications. YOU HAD AN ENDOSCOPIC PROCEDURE TODAY AT THE  ENDOSCOPY CENTER:   Refer to the procedure report that was given to you for any specific questions about what was found during the examination.  If the procedure report does not answer your questions, please call your gastroenterologist to clarify.  If you requested that your care partner not be given the details of your procedure findings, then the procedure report has been included in a sealed envelope for you to review at your convenience later.  YOU SHOULD EXPECT: Some feelings of bloating in the abdomen. Passage of more gas than usual.  Walking can help get rid of the air that was put into your GI tract during the procedure and reduce the bloating. If you had a lower endoscopy (such as a colonoscopy or flexible sigmoidoscopy) you may notice spotting of blood in your stool or on the toilet paper. If you underwent a bowel prep for your procedure, you may not have a normal bowel movement for a few days.  Please Note:  You might notice some irritation and congestion in your nose or some drainage.  This is from the oxygen used during your procedure.  There is no need for concern and it should clear up in a day or so.  SYMPTOMS TO REPORT IMMEDIATELY:   Following lower endoscopy (colonoscopy or flexible sigmoidoscopy):  Excessive amounts of blood in the stool  Significant tenderness or worsening of abdominal pains  Swelling of the abdomen that is new, acute  Fever of 100F or higher   For urgent or emergent issues, a gastroenterologist can be reached at any hour by calling [phone].   DIET:  We do recommend a small meal at first, but then you may proceed to your regular diet.  Drink plenty of fluids but you should avoid alcoholic beverages for 24 hours.  ACTIVITY:  You should plan to take it easy for the rest of today and you should NOT DRIVE or use heavy  machinery until tomorrow (because of the sedation medicines used during the test).    FOLLOW UP: Our staff will call the number listed on your records 48-72 hours following your procedure to check on you and address any questions or concerns that you may have regarding the information given to you following your procedure. If we do not reach you, we will leave a message.  We will attempt to reach you two times.  During this call, we will ask if you have developed any symptoms of COVID 19. If you develop any symptoms (ie: fever, flu-like symptoms, shortness of breath, cough etc.) before then, please call [phone].  If you test positive for Covid 19 in the 2 weeks post procedure, please call and report this information to us.    If any biopsies were taken you will be contacted by phone or by letter within the next 1-3 weeks.  Please call us at [phone] if you have not heard about the biopsies in 3 weeks.    SIGNATURES/CONFIDENTIALITY: You and/or your care partner have signed paperwork which will be entered into your electronic medical record.  These signatures attest to the fact that that the information above on your After Visit Summary has been reviewed and is understood.  Full responsibility of the confidentiality of this discharge information lies with you and/or your care-partner. 

## 2018-10-04 ENCOUNTER — Telehealth: Payer: Self-pay

## 2018-10-04 NOTE — Telephone Encounter (Signed)
Wrong number - unable to reach patient. 

## 2018-10-08 ENCOUNTER — Encounter: Payer: Self-pay | Admitting: Internal Medicine

## 2019-01-13 IMAGING — CR DG KNEE COMPLETE 4+V*R*
5 series · 5 of 5 positions shown · non-contrast
Comparison: Right knee MRI February 28, 2007

CLINICAL DATA: Pain following twisting injury after stepping in
hole

EXAM:
RIGHT KNEE - COMPLETE 4+ VIEW

[w knee ap right]
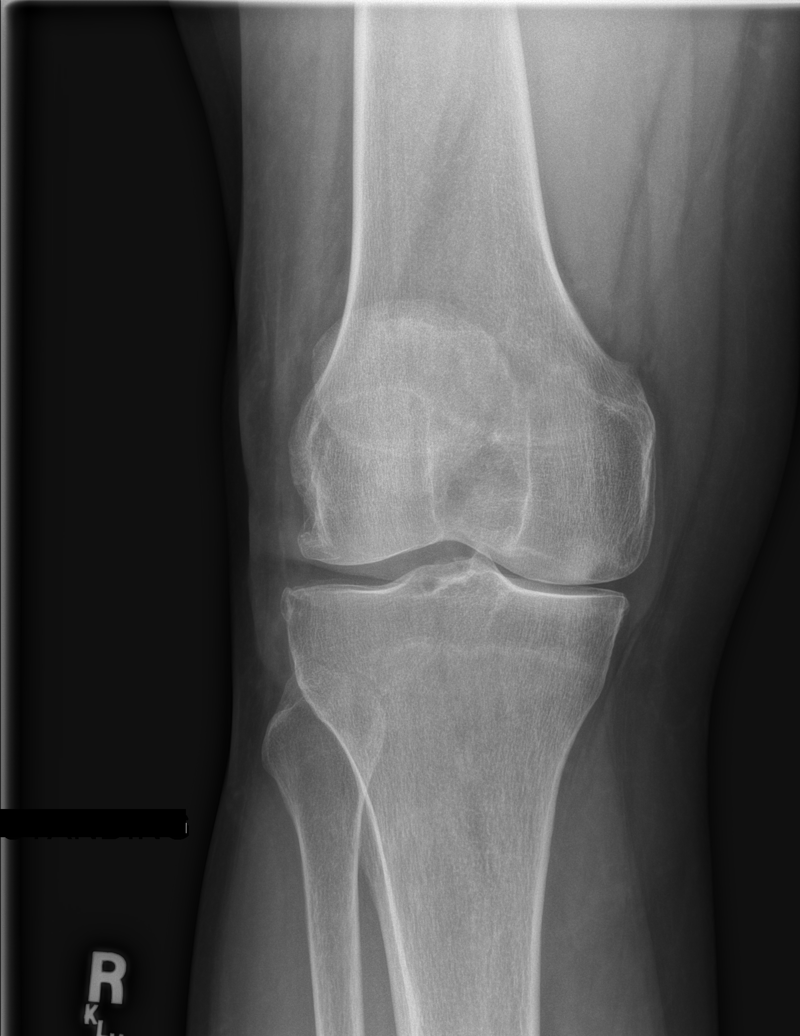

[w knee lat right (1 of 2)]
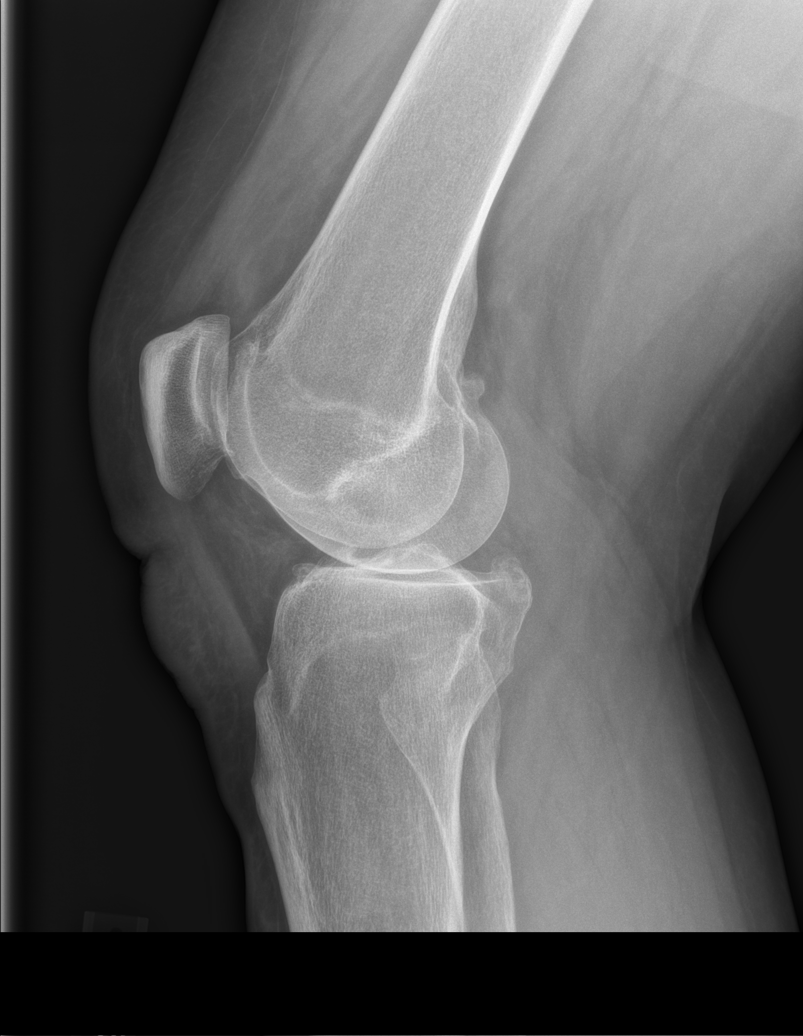

[w knee tunnel pa right]
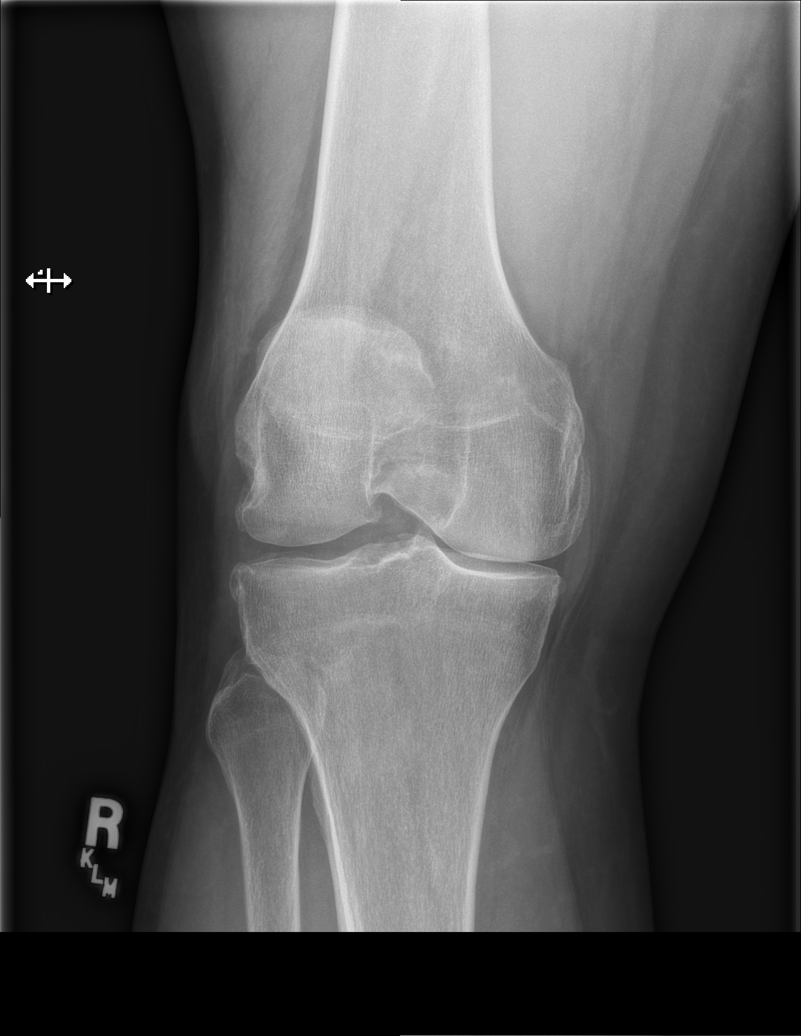

[w knee lat right (2 of 2)]
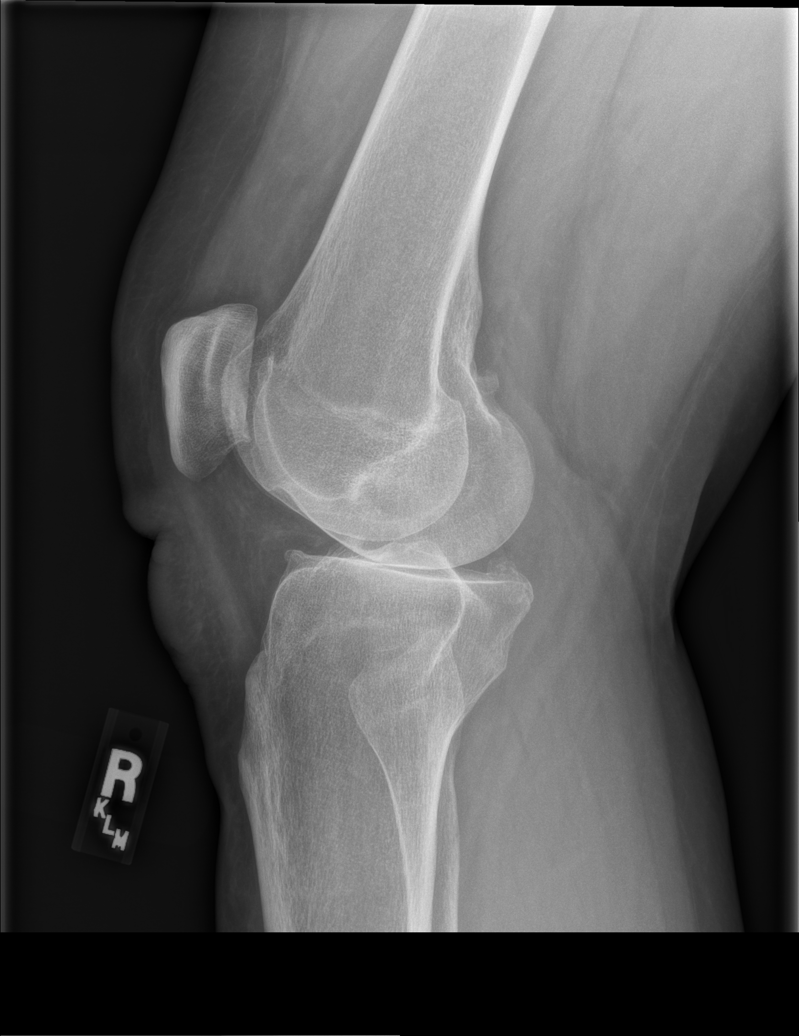

[x knee sunrise right]
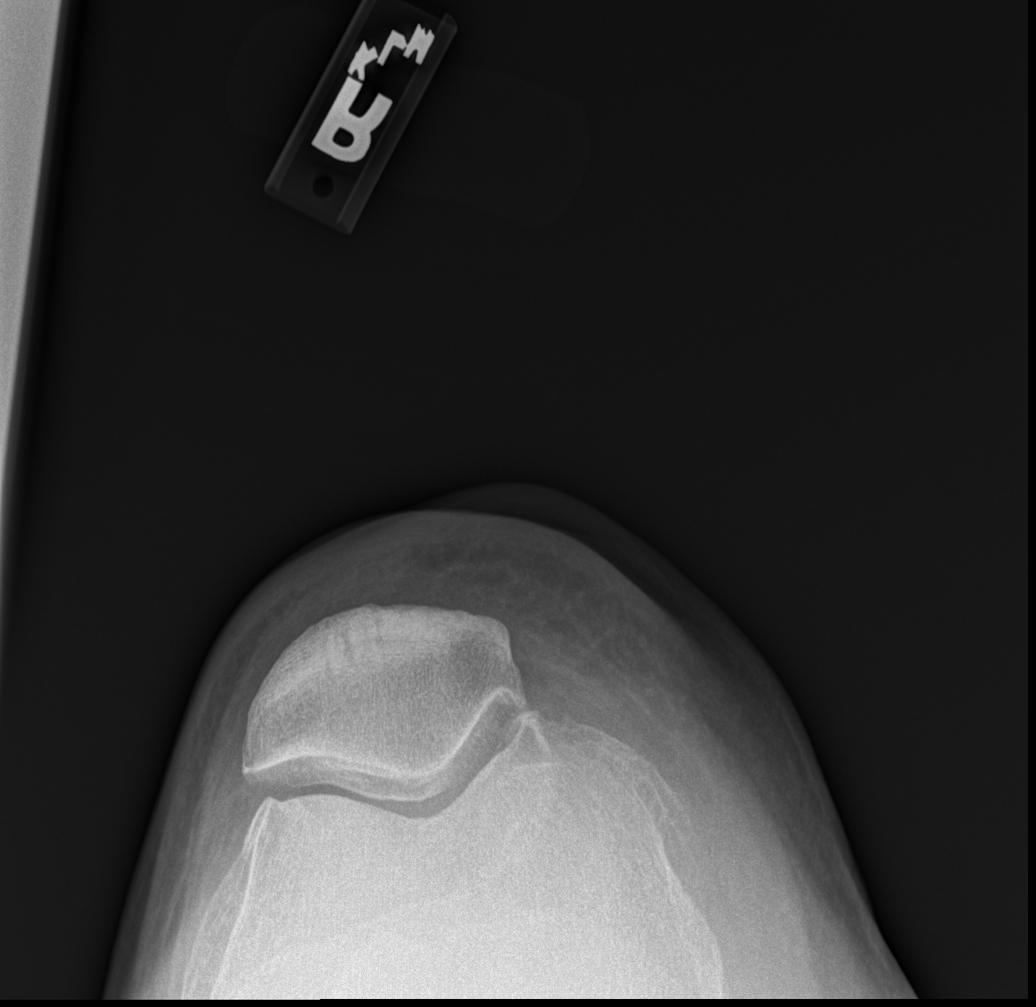

[5 of 5 positions shown; findings below may reference images not displayed]

FINDINGS: Standing frontal, standing tunnel, standing lateral, and sunrise
patellar images obtained. There is no appreciable fracture or
dislocation. There is no appreciable joint effusion. There is
moderate joint space narrowing medially. There is slightly milder
narrowing of the patellofemoral joint. There is spurring in all
compartments. No erosive change.
IMPRESSION: Osteoarthritic change, most notably medially and in the
patellofemoral joint regions. No fracture or joint effusion.

## 2019-01-14 ENCOUNTER — Other Ambulatory Visit: Payer: Self-pay

## 2019-01-14 ENCOUNTER — Telehealth: Payer: Self-pay | Admitting: Physician Assistant

## 2019-01-14 ENCOUNTER — Ambulatory Visit
Admission: EM | Admit: 2019-01-14 | Discharge: 2019-01-14 | Disposition: A | Discharge status: Home / Self Care | Payer: 59 | Attending: Physician Assistant | Admitting: Physician Assistant

## 2019-01-14 DIAGNOSIS — J029 Acute pharyngitis, unspecified: Secondary | ICD-10-CM

## 2019-01-14 DIAGNOSIS — Z20828 Contact with and (suspected) exposure to other viral communicable diseases: Secondary | ICD-10-CM

## 2019-01-14 DIAGNOSIS — I10 Essential (primary) hypertension: Secondary | ICD-10-CM

## 2019-01-14 LAB — POCT RAPID STREP A (OFFICE): Rapid Strep A Screen: NEGATIVE

## 2019-01-14 MED ORDER — FLUTICASONE PROPIONATE 50 MCG/ACT NA SUSP: 2.0000 | Freq: Every day | NASAL | 0 refills | Status: AC

## 2019-01-14 MED ORDER — LIDOCAINE VISCOUS HCL 2 % MT SOLN: OROMUCOSAL | 0 refills | Status: AC

## 2019-01-14 NOTE — Telephone Encounter (Signed)
Patient called back requesting medicine for sore throat. States painful swallowing without trouble breathing, swelling of the throat, tripoding, drooling, trismus. Viscous lidocaine and flonase called into pharmacy. Return precautions given. Patient expresses understanding and agrees to plan.

## 2019-01-14 NOTE — Discharge Instructions (Addendum)
Rapid strep negative. COVID pending. I would like you to quarantine until testing results, usually 3-5 days. Depending on results, we will adjust your quarantine requirements. You can take over the counter flonase/nasacort to help with nasal congestion/drainage. If experiencing shortness of breath, trouble breathing, go to the emergency department for further evaluation needed.

## 2019-01-14 NOTE — ED Triage Notes (Signed)
Pt c/o sore throat since last night. States hx of kidney transplant in 2008

## 2019-01-14 NOTE — ED Provider Notes (Signed)
[facility]    CSN: [id number] Arrival date & time: 01/14/19  0802      History   Chief Complaint Chief Complaint  Patient presents with  . Sore Throat    HPI DIRON HADDON is a 38 y.o. male.   38 year old with history of renal transplant 2008 comes in for 2-day history of sore throat, rhinorrhea.  States feels he is more "clearing his throat" than cough.  Denies nasal congestion. Denies fever, chills, body aches. Denies abdominal pain, nausea, vomiting, diarrhea. Denies shortness of breath, loss of taste/smell.  Had positive Covid at workplace, but unsure if he was in contact with the person.  Never smoker.  Patient follows with nephrology. States Cr stable without problems for the last 12 years.      Past Medical History:  Diagnosis Date  . Anal fissure   . Anxiety   . Arthritis   . Cancer ([place])    skin cancer/squamous cell on face  . Chronic renal failure 09/2006   post kidney transplantation  . GERD (gastroesophageal reflux disease)   . Hypertension   . Internal hemorrhoids   . Pancreatitis   . Ulcerative colitis [facility])     Patient Active Problem List   Diagnosis Date Noted  . GERD 07/28/2008  . ANAL FISSURE 02/20/2008  . HYPERTENSION 04/16/2007  . ULCERATIVE COLITIS, UNIVERSAL 04/16/2007  . CHRONIC KIDNEY DISEASE STAGE IV (SEVERE) 04/16/2007    Past Surgical History:  Procedure Laterality Date  . ANTERIOR CRUCIATE LIGAMENT REPAIR Left   . ANUS SURGERY  2018  . APPENDECTOMY    . KIDNEY TRANSPLANT  2008  . KNEE CARTILAGE SURGERY Left    x2  . KNEE SURGERY  2018   right knee  . MENISCUS REPAIR Left   . STOMACH SURGERY    . UMBILICAL HERNIA REPAIR         Home Medications    Prior to Admission medications   Medication Sig Start Date End Date Taking? Authorizing Provider  amLODipine (NORVASC) 10 MG tablet Take 1 tablet by mouth daily. 06/10/14   [provider]  aspirin EC 81 MG tablet Take 81 mg by mouth daily.     [provider]  LORazepam (ATIVAN) 0.5 MG tablet Take 0.5 mg by mouth 2 (two) times daily as needed for anxiety.    [provider]  MYFORTIC 360 MG TBEC EC tablet Take 2 tablets by mouth 2 (two) times daily. 08/12/14   [provider]  pantoprazole (PROTONIX) 40 MG tablet Take 1 tablet by mouth as needed.  06/10/14   [provider]  predniSONE (DELTASONE) 5 MG tablet Take 1 tablet by mouth daily. 06/10/14   [provider]  tacrolimus (PROGRAF) 1 MG capsule Take 3 capsules by mouth 2 (two) times daily. 08/12/14   [provider]    Family History Family History  Problem Relation Age of Onset  . Kidney cancer Maternal Grandfather   . Diabetes Paternal Grandmother   . Hyperthyroidism Father     Social History Social History   Tobacco Use  . Smoking status: Never Smoker  . Smokeless tobacco: Former Network engineer Use Topics  . Alcohol use: Yes    Alcohol/week: 0.0 standard drinks    Comment: occasional  . Drug use: No     Allergies   Midazolam, Piperacillin-tazobactam in dex, Mercaptopurine, Mesalamine, and Mycophenolate mofetil   Review of Systems Review of Systems  Reason unable to perform ROS: See HPI  as above.     Physical Exam Triage Vital Signs ED Triage Vitals  Enc Vitals Group     BP 01/14/19 0813 (!) 164/103     Pulse Rate 01/14/19 0813 87     Resp 01/14/19 0813 18     Temp 01/14/19 0813 98.1 F (36.7 C)     Temp Source 01/14/19 0813 Oral     SpO2 01/14/19 0813 98 %     Weight --      Height --      Head Circumference --      Peak Flow --      Pain Score 01/14/19 0814 3     Pain Loc --      Pain Edu? --      Excl. in [place]? --    No data found.  Updated Vital Signs BP (!) 164/103 (BP Location: Left Arm)   Pulse 87   Temp 98.1 F (36.7 C) (Oral)   Resp 18   SpO2 98%   Physical Exam Constitutional:      General: He is not in acute distress.    Appearance: Normal appearance. He is not  ill-appearing, toxic-appearing or diaphoretic.  HENT:     Head: Normocephalic and atraumatic.     Mouth/Throat:     Mouth: Mucous membranes are moist.     Pharynx: Oropharynx is clear. Uvula midline. Posterior oropharyngeal erythema present.     Tonsils: No tonsillar exudate. 1+ on the right. 1+ on the left.  Neck:     Musculoskeletal: Normal range of motion and neck supple.  Cardiovascular:     Rate and Rhythm: Normal rate and regular rhythm.     Heart sounds: Normal heart sounds. No murmur. No friction rub. No gallop.   Pulmonary:     Effort: Pulmonary effort is normal. No accessory muscle usage, prolonged expiration, respiratory distress or retractions.     Comments: Lungs clear to auscultation without adventitious lung sounds. Neurological:     General: No focal deficit present.     Mental Status: He is alert and oriented to person, place, and time.      UC Treatments / Results  Labs (all labs ordered are listed, but only abnormal results are displayed) Labs Reviewed  POCT RAPID STREP A (OFFICE) - Normal  NOVEL CORONAVIRUS, NAA  CULTURE, GROUP A STREP [facility])    EKG   Radiology No results found.  Procedures Procedures (including critical care time)  Medications Ordered in UC Medications - No data to display  Initial Impression / Assessment and Plan / UC Course  I have reviewed the triage vital signs and the nursing notes.  Pertinent labs & imaging results that were available during my care of the patient were reviewed by me and considered in my medical decision making (see chart for details).    Rapid strep negative. COVID testing sent. Patient to quarantine until testing results return. No alarming signs on exam.  Patient speaking in full sentences without respiratory distress.  Symptomatic treatment discussed.  Push fluids.  Return precautions given.  Patient expresses understanding and agrees to plan.  Final Clinical Impressions(s) / UC Diagnoses   Final  diagnoses:  Sore throat   ED Prescriptions    None     PDMP not reviewed this encounter.   Ok Edwards, PA-C 01/14/19 [phone]

## 2019-01-16 LAB — NOVEL CORONAVIRUS, NAA: SARS-CoV-2, NAA: NOT DETECTED

## 2019-01-16 LAB — CULTURE, GROUP A STREP (THRC)

## 2020-01-02 ENCOUNTER — Telehealth: Payer: Self-pay | Admitting: Internal Medicine

## 2020-01-02 ENCOUNTER — Other Ambulatory Visit: Payer: Self-pay

## 2020-01-02 MED ORDER — DILTIAZEM GEL 2 %: CUTANEOUS | 1 refills | Status: DC

## 2020-01-02 NOTE — Telephone Encounter (Signed)
Linda, please send Rx for diltiazem 2% gel.  Please advise patient to apply small pea-sized amount per rectum 3 times daily for 6 to 8 weeks.  Avoid excessive straining during defecation.  Start Benefiber 1 tablespoon twice daily with meals.  Schedule follow-up visit next available in 2 months.  Thank you

## 2020-01-02 NOTE — Telephone Encounter (Signed)
Spoke with pt and he is aware. [facility], pt scheduled to see Dr. Henrene Pastor 03/08/20@8 :20am.

## 2020-01-02 NOTE — Telephone Encounter (Signed)
Patient called said he thinks he has another fissure and is seeking advise and\or medication

## 2020-01-02 NOTE — Telephone Encounter (Signed)
Joe [name] pt calling,pt states he has had anal fissures in the past and thinks he has one again. Reports he has pain and is seeing [facility]B when he wipes after BM. Dr. Silverio Decamp as DOD please advise.

## 2020-01-14 ENCOUNTER — Other Ambulatory Visit: Payer: Self-pay | Admitting: Nurse Practitioner

## 2020-01-14 DIAGNOSIS — U071 COVID-19: Secondary | ICD-10-CM

## 2020-01-14 DIAGNOSIS — N184 Chronic kidney disease, stage 4 (severe): Secondary | ICD-10-CM

## 2020-01-14 NOTE — Progress Notes (Signed)
I connected by phone with Joe [name] on 01/14/2020 at 4:55 PM to discuss the potential use of a new treatment for mild to moderate COVID-19 viral infection in non-hospitalized patients.  This patient is a 39 y.o. male that meets the FDA criteria for Emergency Use Authorization of COVID monoclonal antibody casirivimab/imdevimab, bamlanivimab/eteseviamb, or sotrovimab.  Has a (+) direct SARS-CoV-2 viral test result  Has mild or moderate COVID-19   Is NOT hospitalized due to COVID-19  Is within 10 days of symptom onset  Has at least one of the high risk factor(s) for progression to severe COVID-19 and/or hospitalization as defined in EUA.  Specific high risk criteria : BMI > 25, Chronic Kidney Disease (CKD) and Immunosuppressive Disease or Treatment   I have spoken and communicated the following to the patient or parent/caregiver regarding COVID monoclonal antibody treatment:  1. FDA has authorized the emergency use for the treatment of mild to moderate COVID-19 in adults and pediatric patients with positive results of direct SARS-CoV-2 viral testing who are 55 years of age and older weighing at least 40 kg, and who are at high risk for progressing to severe COVID-19 and/or hospitalization.  2. The significant known and potential risks and benefits of COVID monoclonal antibody, and the extent to which such potential risks and benefits are unknown.  3. Information on available alternative treatments and the risks and benefits of those alternatives, including clinical trials.  4. Patients treated with COVID monoclonal antibody should continue to self-isolate and use infection control measures (e.g., wear mask, isolate, social distance, avoid sharing personal items, clean and disinfect "high touch" surfaces, and frequent handwashing) according to CDC guidelines.   5. The patient or parent/caregiver has the option to accept or refuse COVID monoclonal antibody treatment.  After  reviewing this information with the patient, the patient has agreed to receive one of the available covid 19 monoclonal antibodies and will be provided an appropriate fact sheet prior to infusion. Jobe Gibbon, NP 01/14/2020 4:55 PM

## 2020-01-16 ENCOUNTER — Ambulatory Visit (HOSPITAL_COMMUNITY)
Admission: RE | Admit: 2020-01-16 | Discharge: 2020-01-16 | Disposition: A | Discharge status: Home / Self Care | Payer: 59 | Source: Ambulatory Visit | Attending: Pulmonary Disease | Admitting: Pulmonary Disease

## 2020-01-16 DIAGNOSIS — N184 Chronic kidney disease, stage 4 (severe): Secondary | ICD-10-CM

## 2020-01-16 DIAGNOSIS — U071 COVID-19: Secondary | ICD-10-CM

## 2020-01-16 MED ORDER — SODIUM CHLORIDE 0.9 % IV SOLN: INTRAVENOUS | Status: DC | PRN

## 2020-01-16 MED ORDER — DIPHENHYDRAMINE HCL 50 MG/ML IJ SOLN: 50.0000 mg | Freq: Once | INTRAMUSCULAR | Status: DC | PRN

## 2020-01-16 MED ORDER — ALBUTEROL SULFATE HFA 108 (90 BASE) MCG/ACT IN AERS: 2.0000 | INHALATION_SPRAY | Freq: Once | RESPIRATORY_TRACT | Status: DC | PRN

## 2020-01-16 MED ORDER — EPINEPHRINE 0.3 MG/0.3ML IJ SOAJ: 0.3000 mg | Freq: Once | INTRAMUSCULAR | Status: DC | PRN

## 2020-01-16 MED ORDER — SOTROVIMAB 500 MG/8ML IV SOLN
500.0000 mg | Freq: Once | INTRAVENOUS | Status: AC
  Administered 2020-01-16: 500 mg via INTRAVENOUS

## 2020-01-16 MED ORDER — METHYLPREDNISOLONE SODIUM SUCC 125 MG IJ SOLR: 125.0000 mg | Freq: Once | INTRAMUSCULAR | Status: DC | PRN

## 2020-01-16 MED ORDER — FAMOTIDINE IN NACL 20-0.9 MG/50ML-% IV SOLN: 20.0000 mg | Freq: Once | INTRAVENOUS | Status: DC | PRN

## 2020-01-16 NOTE — Progress Notes (Signed)
Diagnosis: COVID-19  Physician: Dr. Patrick Wright  Procedure: Covid Infusion Clinic Med: Sotrovimab infusion - Provided patient with sotrovimab fact sheet for patients, parents, and caregivers prior to infusion.   Complications: No immediate complications noted  Discharge: Discharged home    

## 2020-01-16 NOTE — Progress Notes (Signed)
1134 Patient reviewed Fact Sheet for Patients, Parents, and Caregivers for Emergency Use Authorization (EUA) of Sotrovimab for the Treatment of Coronavirus. Patient also reviewed and is agreeable to the estimated cost of treatment. Patient is agreeable to proceed.

## 2020-01-16 NOTE — Discharge Instructions (Signed)

## 2020-03-03 ENCOUNTER — Ambulatory Visit: Payer: 59 | Admitting: Internal Medicine

## 2020-03-08 ENCOUNTER — Ambulatory Visit: Payer: 59 | Admitting: Internal Medicine

## 2020-04-16 ENCOUNTER — Ambulatory Visit: Payer: 59 | Admitting: Internal Medicine

## 2020-04-16 ENCOUNTER — Encounter: Payer: Self-pay | Admitting: Internal Medicine

## 2020-04-16 VITALS — BP 149/79 | HR 80 | Ht 74.0 in | Wt 275.0 lb

## 2020-04-16 DIAGNOSIS — K5010 Crohn's disease of large intestine without complications: Secondary | ICD-10-CM

## 2020-04-16 DIAGNOSIS — K51019 Ulcerative (chronic) pancolitis with unspecified complications: Secondary | ICD-10-CM

## 2020-04-16 DIAGNOSIS — K602 Anal fissure, unspecified: Secondary | ICD-10-CM

## 2020-04-16 MED ORDER — DILTIAZEM GEL 2 %: CUTANEOUS | 2 refills | Status: AC

## 2020-04-16 NOTE — Progress Notes (Signed)
HISTORY OF PRESENT ILLNESS:  Joe [name] is a 40 y.o. 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  The patient was last evaluated in this office June 2016.  He last underwent surveillance colonoscopy August 2020.  Examination revealed patchy colonic erythema but was otherwise normal.  Terminal ileum was normal.  He was found to have areas of inactive chronic colitis with rare granulomas s focally mildly active colitis.  Routine follow-up in 3 years recommended.  Also has a history of perirectal fistula abscess for which he required surgical attention at [facility].  Patient contacted the office November 2021 complaining of an anal fissure.  My partner prescribed 2% diltiazem ointment and fiber.  Patient states that this helped but no further problems since.  However, he did request a refill of his medication and he was asked to come in the office to see me.  Currently his GI review of systems is negative.  No change in bowel habits or bleeding.  No evidence for perirectal fistulous for fissure disease.  Review of blood work from February 06, 2020 shows normal CBC with hemoglobin 14.6.  His creatinine at that time was 1.32.  His renal immunosuppression regimen includes Myfortic, prednisone 5 mg, and tacrolimus.  For his chronic GERD he requires PPI and is maintained on pantoprazole 40 mg daily.  He has completed his COVID vaccination series.  Was already here this mother was recently diagnosed with ovarian cancer  REVIEW OF SYSTEMS:  All non-GI ROS negative as otherwise stated in the HPI.  Past Medical History:  Diagnosis Date  . Anal fissure   . Anxiety   . Arthritis   . Cancer ([place])    skin cancer/squamous cell on face  . Chronic  renal failure 09/2006   post kidney transplantation  . GERD (gastroesophageal reflux disease)   . Hypertension   . Internal hemorrhoids   . Pancreatitis   . Ulcerative colitis [facility])     Past Surgical History:  Procedure Laterality Date  . ANTERIOR CRUCIATE LIGAMENT REPAIR Left   . ANUS SURGERY  2018  . APPENDECTOMY    . KIDNEY TRANSPLANT  2008  . KNEE CARTILAGE SURGERY Left    x2  . KNEE SURGERY  2018   right knee  . MENISCUS REPAIR Left   . STOMACH SURGERY    . UMBILICAL HERNIA REPAIR      Social History Joe [name]  reports that he has never smoked. He quit smokeless tobacco use about 15 years ago. He reports current alcohol use. He reports that he does not use drugs.  family history includes Diabetes in his paternal grandmother; Hyperthyroidism in his father; Kidney cancer in his maternal grandfather.  Allergies  Allergen Reactions  . Midazolam     Other reaction(s): Other (See Comments) Pt states that he becomes extremely combative and aggressive when he received Versed  . Piperacillin-Tazobactam In Dex Rash    Got zosyn and developed rash/hives/burning. Patient tolerated Keflex.   . Mercaptopurine Other (See Comments)    Other Reaction: fever, chills  . Mesalamine Other (See Comments)    Other Reaction: ESRD  . Mycophenolate Mofetil     Other reaction(s): Other (See Comments) Headaches       PHYSICAL EXAMINATION: Vital signs: BP (!) 149/79  Pulse 80   Ht 6\' 2"  (1.88 m)   Wt 275 lb (124.7 kg)   BMI 35.31 kg/m   Constitutional: generally well-appearing, no acute distress Psychiatric: alert and oriented x3, cooperative Eyes: Anicteric Abdomen: Not examined Rectal: Deferred Skin: no lesions on visible extremities.  No jaundice Neuro: No focal deficits.   ASSESSMENT:  1.  Chronic Crohn's colitis.  Clinically in remission.  Last colonoscopy August 2020 2.  Anal fissure.  Asymptomatic after treatment with diltiazem 3.  History of perirectal  abscess perirectal fistula requiring surgical attention at [facility]. 4.  GERD.  Requiring PPI therapy to control symptoms 5.  Status post renal transplantation on immunosuppressive therapy  PLAN:  1.  Refill diltiazem 2% ointment.  Use on demand 2.  Fiber supplementation 3.  No specific maintenance medications for his IBD given his multiple immunosuppressant agents and mild findings on most recent colonoscopy 4.  Reflux precautions 5.  Continue pantoprazole daily 6.  Surveillance colonoscopy August 2023.  Contact the office in the interim for questions or problems some

## 2020-04-16 NOTE — Patient Instructions (Signed)
We have sent a prescription for Diltiazem gel to [facility]. You should apply a pea size amount to your rectum three times daily x 6-8 weeks.  [facility]'s information is below: Address: [address], [place], [place] [address]  [phone]  *Please DO NOT go directly from our office to pick up this medication! Give the pharmacy 1 day to process the prescription as this is compounded and takes time to make.

## 2021-04-28 ENCOUNTER — Other Ambulatory Visit: Payer: Self-pay

## 2021-04-28 ENCOUNTER — Ambulatory Visit
Admission: EM | Admit: 2021-04-28 | Discharge: 2021-04-28 | Disposition: A | Discharge status: Home / Self Care | Payer: 59 | Attending: Physician Assistant | Admitting: Physician Assistant

## 2021-04-28 DIAGNOSIS — J020 Streptococcal pharyngitis: Secondary | ICD-10-CM | POA: Diagnosis not present

## 2021-04-28 LAB — POCT RAPID STREP A (OFFICE): Rapid Strep A Screen: POSITIVE — AB

## 2021-04-28 MED ORDER — CEPHALEXIN 250 MG PO CAPS: 250.0000 mg | ORAL_CAPSULE | Freq: Four times a day (QID) | ORAL | 0 refills | Status: DC

## 2021-04-28 NOTE — ED Triage Notes (Signed)
Pt presents with sore throat and cough. Son is positive for strep. ?

## 2021-04-28 NOTE — ED Provider Notes (Signed)
?[facility] ? ? ? ?CSN: [id number] ?Arrival date & time: 04/28/21  1639 ? ? ?  ? ?History   ?Chief Complaint ?No chief complaint on file. ? ? ?HPI ?Joe [name] is a 41 y.o. male.  ? ?Patient here today for evaluation of sore throat that started recently. His son tested positive for strep. He has not had fever. He has had some nausea today. He does not report any treatment for symptoms. ? ?The history is provided by the patient.  ? ?Past Medical History:  ?Diagnosis Date  ? Anal fissure   ? Anxiety   ? Arthritis   ? Cancer [facility])   ? skin cancer/squamous cell on face  ? Chronic renal failure 09/2006  ? post kidney transplantation  ? GERD (gastroesophageal reflux disease)   ? Hypertension   ? Internal hemorrhoids   ? Pancreatitis   ? Ulcerative colitis ([place])   ? ? ?Patient Active Problem List  ? Diagnosis Date Noted  ? GERD 07/28/2008  ? ANAL FISSURE 02/20/2008  ? HYPERTENSION 04/16/2007  ? ULCERATIVE COLITIS, UNIVERSAL 04/16/2007  ? CHRONIC KIDNEY DISEASE STAGE IV (SEVERE) 04/16/2007  ? ? ?Past Surgical History:  ?Procedure Laterality Date  ? ANTERIOR CRUCIATE LIGAMENT REPAIR Left   ? ANUS SURGERY  2018  ? APPENDECTOMY    ? KIDNEY TRANSPLANT  2008  ? KNEE CARTILAGE SURGERY Left   ? x2  ? KNEE SURGERY  2018  ? right knee  ? MENISCUS REPAIR Left   ? STOMACH SURGERY    ? UMBILICAL HERNIA REPAIR    ? ? ? ? ? ?Home Medications   ? ?Prior to Admission medications   ?Medication Sig Start Date End Date Taking? Authorizing Provider  ?cephALEXin (KEFLEX) 250 MG capsule Take 1 capsule (250 mg total) by mouth 4 (four) times daily. 04/28/21  Yes Francene Finders, PA-C  ?amLODipine (NORVASC) 10 MG tablet Take 1 tablet by mouth daily. 06/10/14   [provider]  ?aspirin EC 81 MG tablet Take 81 mg by mouth daily.    [provider]  ?diltiazem 2 % GEL Apply pea sized amount to rectal area 3 times daily 04/16/20   Irene Shipper, MD  ?fluticasone [facility]) 50 MCG/ACT nasal spray Place 2 sprays into  both nostrils daily. 01/14/19   Tasia Catchings, Amy V, PA-C  ?lidocaine (XYLOCAINE) 2 % solution 5-15 mL gurgle as needed 01/14/19   Ok Edwards, PA-C  ?LORazepam (ATIVAN) 0.5 MG tablet Take 0.5 mg by mouth 2 (two) times daily as needed for anxiety.    [provider]  ?MYFORTIC 360 MG TBEC EC tablet Take 2 tablets by mouth 2 (two) times daily. 08/12/14   [provider]  ?pantoprazole (PROTONIX) 40 MG tablet Take 1 tablet by mouth as needed. 06/10/14   [provider]  ?predniSONE (DELTASONE) 5 MG tablet Take 1 tablet by mouth daily. 06/10/14   [provider]  ?tacrolimus (PROGRAF) 1 MG capsule Take 3 capsules by mouth 2 (two) times daily. 08/12/14   [provider]  ? ? ?Family History ?Family History  ?Problem Relation Age of Onset  ? Kidney cancer Maternal Grandfather   ? Diabetes Paternal Grandmother   ? Hyperthyroidism Father   ? ? ?Social History ?Social History  ? ?Tobacco Use  ? Smoking status: Never  ? Smokeless tobacco: Former  ?  Quit date: 02/20/2005  ?Substance Use Topics  ? Alcohol use: Yes  ?  Alcohol/week: 0.0 standard drinks  ?  Comment: occasional  ? Drug use: No  ? ? ? ?Allergies   ?Midazolam, Piperacillin-tazobactam in dex, Mercaptopurine, Mesalamine, and Mycophenolate mofetil ? ? ?Review of Systems ?Review of Systems  ?Constitutional:  Negative for chills and fever.  ?HENT:  Positive for sore throat. Negative for congestion and ear pain.   ?Eyes:  Negative for discharge and redness.  ?Respiratory:  Positive for cough. Negative for shortness of breath.   ?Gastrointestinal:  Positive for nausea. Negative for abdominal pain and vomiting.  ? ? ?Physical Exam ?Triage Vital Signs ?ED Triage Vitals  ?Enc Vitals Group  ?   BP   ?   Pulse   ?   Resp   ?   Temp   ?   Temp src   ?   SpO2   ?   Weight   ?   Height   ?   Head Circumference   ?   Peak Flow   ?   Pain Score   ?   Pain Loc   ?   Pain Edu?   ?   Excl. in [place]?   ? ?No data found. ? ?Updated Vital Signs ?BP 129/84 (BP  Location: Left Arm)   Pulse 82   Temp 98 ?F (36.7 ?C) (Oral)   Resp 16   SpO2 95%  ?   ? ?Physical Exam ?Vitals and nursing note reviewed.  ?Constitutional:   ?   General: He is not in acute distress. ?   Appearance: Normal appearance. He is not ill-appearing.  ?HENT:  ?   Head: Normocephalic and atraumatic.  ?   Nose: Nose normal. No congestion.  ?   Mouth/Throat:  ?   Mouth: Mucous membranes are moist.  ?   Pharynx: Oropharynx is clear. Posterior oropharyngeal erythema present. No oropharyngeal exudate.  ?Eyes:  ?   Conjunctiva/sclera: Conjunctivae normal.  ?Cardiovascular:  ?   Rate and Rhythm: Normal rate.  ?Pulmonary:  ?   Effort: Pulmonary effort is normal. No respiratory distress.  ?Skin: ?   General: Skin is warm and dry.  ?Neurological:  ?   Mental Status: He is alert.  ?Psychiatric:     ?   Mood and Affect: Mood normal.     ?   Thought Content: Thought content normal.  ? ? ? ?UC Treatments / Results  ?Labs ?(all labs ordered are listed, but only abnormal results are displayed) ?Labs Reviewed  ?POCT RAPID STREP A (OFFICE) - Abnormal; Notable for the following components:  ?    Result Value  ? Rapid Strep A Screen Positive (*)   ? All other components within normal limits  ? ? ?EKG ? ? ?Radiology ?No results found. ? ?Procedures ?Procedures (including critical care time) ? ?Medications Ordered in UC ?Medications - No data to display ? ?Initial Impression / Assessment and Plan / UC Course  ?I have reviewed the triage vital signs and the nursing notes. ? ?Pertinent labs & imaging results that were available during my care of the patient were reviewed by me and considered in my medical decision making (see chart for details). ? ?  ?Strep test positive in office. Will treat to cover with keflex as he has taken that safely before. Recommend follow up with any further concerns or lingering symptoms.  ? ?Final Clinical Impressions(s) / UC Diagnoses  ? ?Final diagnoses:  ?Streptococcal sore throat  ? ?Discharge  Instructions   ?None ?  ? ?ED Prescriptions   ? ? Medication Sig  Dispense Auth. Provider  ? cephALEXin (KEFLEX) 250 MG capsule Take 1 capsule (250 mg total) by mouth 4 (four) times daily. 28 capsule Francene Finders, PA-C  ? ?  ? ?PDMP not reviewed this encounter. ?  ?Francene Finders, PA-C ?04/28/21 1716 ? ?

## 2021-09-04 ENCOUNTER — Ambulatory Visit
Admission: EM | Admit: 2021-09-04 | Discharge: 2021-09-04 | Disposition: A | Discharge status: Home / Self Care | Payer: 59 | Attending: Physician Assistant | Admitting: Physician Assistant

## 2021-09-04 DIAGNOSIS — L03113 Cellulitis of right upper limb: Secondary | ICD-10-CM

## 2021-09-04 DIAGNOSIS — D84821 Immunodeficiency due to drugs: Secondary | ICD-10-CM | POA: Diagnosis not present

## 2021-09-04 DIAGNOSIS — Z79899 Other long term (current) drug therapy: Secondary | ICD-10-CM | POA: Diagnosis not present

## 2021-09-04 DIAGNOSIS — Z940 Kidney transplant status: Secondary | ICD-10-CM | POA: Diagnosis not present

## 2021-09-04 MED ORDER — DOXYCYCLINE HYCLATE 100 MG PO CAPS: 100.0000 mg | ORAL_CAPSULE | Freq: Two times a day (BID) | ORAL | 0 refills | Status: AC

## 2021-09-04 MED ORDER — CEPHALEXIN 250 MG PO CAPS: 250.0000 mg | ORAL_CAPSULE | Freq: Four times a day (QID) | ORAL | 0 refills | Status: AC

## 2021-09-04 NOTE — ED Provider Notes (Signed)
[facility]    CSN: [id number] Arrival date & time: 09/04/21  3086      History   Chief Complaint Chief Complaint  Patient presents with   wound infection/s/p kidney transplant    HPI Joe [name] is a 41 y.o. male.   Patient presents today with a 1 day history of enlarging erythematous area over his right elbow.  Reports that approximately 5 days ago he cut his elbow on the car door.  He denies any ongoing pain or redness.  He has been keeping this area clean.  Over the past several days it has become red and the redness has been spreading prompting evaluation.  Earlier it was very painful though it has improved.  Currently pain is rated 3 on a 0-10 pain scale, described as aching, no aggravating relieving factors identified.  He has tried some Tylenol with improvement of symptoms.  Denies any fever, nausea, vomiting, body aches.  Denies any recent antibiotic use.  He does have a history of immunosuppression related to previous kidney transplant and immunosuppressive medication regimen.  He does report some numbness/tingling sensation extending into his forearm.    Past Medical History:  Diagnosis Date   Anal fissure    Anxiety    Arthritis    Cancer ([place])    skin cancer/squamous cell on face   Chronic renal failure 09/2006   post kidney transplantation   GERD (gastroesophageal reflux disease)    Hypertension    Internal hemorrhoids    Pancreatitis    Ulcerative colitis [facility])     Patient Active Problem List   Diagnosis Date Noted   GERD 07/28/2008   ANAL FISSURE 02/20/2008   HYPERTENSION 04/16/2007   ULCERATIVE COLITIS, UNIVERSAL 04/16/2007   CHRONIC KIDNEY DISEASE STAGE IV (SEVERE) 04/16/2007    Past Surgical History:  Procedure Laterality Date   ANTERIOR CRUCIATE LIGAMENT REPAIR Left    ANUS SURGERY  2018   APPENDECTOMY     KIDNEY TRANSPLANT  2008   KNEE CARTILAGE SURGERY Left    x2   KNEE SURGERY  2018   right knee   MENISCUS REPAIR  Left    STOMACH SURGERY     UMBILICAL HERNIA REPAIR         Home Medications    Prior to Admission medications   Medication Sig Start Date End Date Taking? Authorizing Provider  doxycycline (VIBRAMYCIN) 100 MG capsule Take 1 capsule (100 mg total) by mouth 2 (two) times daily. 09/04/21  Yes Jarah Pember K, PA-C  amLODipine (NORVASC) 10 MG tablet Take 1 tablet by mouth daily. 06/10/14   [provider]  aspirin EC 81 MG tablet Take 81 mg by mouth daily.    [provider]  cephALEXin (KEFLEX) 250 MG capsule Take 1 capsule (250 mg total) by mouth 4 (four) times daily. 09/04/21   Zahari Fazzino, Derry Skill, PA-C  diltiazem 2 % GEL Apply pea sized amount to rectal area 3 times daily 04/16/20   Irene Shipper, MD  fluticasone [facility]) 50 MCG/ACT nasal spray Place 2 sprays into both nostrils daily. 01/14/19   Tasia Catchings, Amy V, PA-C  lidocaine (XYLOCAINE) 2 % solution 5-15 mL gurgle as needed 01/14/19   Tasia Catchings, Amy V, PA-C  LORazepam (ATIVAN) 0.5 MG tablet Take 0.5 mg by mouth 2 (two) times daily as needed for anxiety.    [provider]  MYFORTIC 360 MG TBEC EC tablet Take 2 tablets by mouth 2 (two) times daily. 08/12/14  [provider]  pantoprazole (PROTONIX) 40 MG tablet Take 1 tablet by mouth as needed. 06/10/14   [provider]  predniSONE (DELTASONE) 5 MG tablet Take 1 tablet by mouth daily. 06/10/14   [provider]  tacrolimus (PROGRAF) 1 MG capsule Take 3 capsules by mouth 2 (two) times daily. 08/12/14   [provider]    Family History Family History  Problem Relation Age of Onset   Kidney cancer Maternal Grandfather    Diabetes Paternal Grandmother    Hyperthyroidism Father     Social History Social History   Tobacco Use   Smoking status: Never   Smokeless tobacco: Former    Quit date: 02/20/2005  Substance Use Topics   Alcohol use: Yes    Alcohol/week: 0.0 standard drinks of alcohol    Comment: occasional   Drug use: No      Allergies   Midazolam, Piperacillin-tazobactam in dex, Mercaptopurine, Mesalamine, and Mycophenolate mofetil   Review of Systems Review of Systems  Constitutional:  Positive for activity change. Negative for appetite change, fatigue and fever.  Respiratory:  Negative for cough and shortness of breath.   Cardiovascular:  Negative for chest pain.  Gastrointestinal:  Negative for abdominal pain, diarrhea, nausea and vomiting.  Musculoskeletal:  Positive for arthralgias.  Skin:  Positive for color change and wound.  Neurological:  Positive for numbness. Negative for dizziness, weakness, light-headedness and headaches.     Physical Exam Triage Vital Signs ED Triage Vitals  Enc Vitals Group     BP 09/04/21 1003 (!) 153/100     Pulse Rate 09/04/21 1003 72     Resp 09/04/21 1003 18     Temp 09/04/21 1003 98 F (36.7 C)     Temp Source 09/04/21 1003 Oral     SpO2 09/04/21 1003 96 %     Weight --      Height --      Head Circumference --      Peak Flow --      Pain Score 09/04/21 1004 0     Pain Loc --      Pain Edu? --      Excl. in [place]? --    No data found.  Updated Vital Signs BP (!) 153/100 (BP Location: Left Arm)   Pulse 72   Temp 98 F (36.7 C) (Oral)   Resp 18   SpO2 96%   Visual Acuity Right Eye Distance:   Left Eye Distance:   Bilateral Distance:    Right Eye Near:   Left Eye Near:    Bilateral Near:     Physical Exam Vitals reviewed.  Constitutional:      General: He is awake.     Appearance: Normal appearance. He is well-developed. He is not ill-appearing.     Comments: Very pleasant male appears stated age in no acute distress sitting comfortably in exam room  HENT:     Head: Normocephalic and atraumatic.     Mouth/Throat:     Pharynx: No oropharyngeal exudate, posterior oropharyngeal erythema or uvula swelling.  Cardiovascular:     Rate and Rhythm: Normal rate and regular rhythm.     Pulses:          Radial pulses are 2+ on the right side.      Heart sounds: Normal heart sounds, S1 normal and S2 normal. No murmur heard.    Comments: Capillary refill within 2 seconds right fingers Pulmonary:     Effort: Pulmonary  effort is normal.     Breath sounds: Normal breath sounds. No stridor. No wheezing, rhonchi or rales.     Comments: Clear to auscultation bilaterally Musculoskeletal:     Right elbow: Decreased range of motion. Tenderness present.     Right hand: No tenderness. Normal range of motion. Normal strength. There is no disruption of two-point discrimination.  Skin:    Findings: Erythema present.     Comments: Small wound noted over right olecranon process with surrounding erythema measuring approximately 4 cm x 3 cm.  No streaking or evidence of lymphangitis.  Neurological:     Mental Status: He is alert.  Psychiatric:        Behavior: Behavior is cooperative.        UC Treatments / Results  Labs (all labs ordered are listed, but only abnormal results are displayed) Labs Reviewed  CBC WITH DIFFERENTIAL/PLATELET  BASIC METABOLIC PANEL    EKG   Radiology No results found.  Procedures Procedures (including critical care time)  Medications Ordered in UC Medications - No data to display  Initial Impression / Assessment and Plan / UC Course  I have reviewed the triage vital signs and the nursing notes.  Pertinent labs & imaging results that were available during my care of the patient were reviewed by me and considered in my medical decision making (see chart for details).     Patient is well-appearing, afebrile, nontoxic, nontachycardic.  Low suspicion for bursitis given associated wound and clinical presentation.  Will cover for bacterial infection given history of immunosuppression with Keflex and doxycycline.  No indication for dose adjustment of Keflex based on 8/41/3244 metabolic panel with creatinine of 1.09 and calculated creatinine clearance of 157.35 mL/min.  He is to keep area clean with soap and  water.  Can use Tylenol for pain.  CBC and CMP were obtained today.  Discussed that if he has significant leukocytosis or abnormal kidney function he needs to go to the emergency room.  If he has any worsening symptoms including fever, spread of erythema, body aches, nausea, vomiting he needs to go to the emergency room.  He is to follow-up with his PCP first thing tomorrow for reevaluation.  Strict return precautions given.  Work note provided.  Final Clinical Impressions(s) / UC Diagnoses   Final diagnoses:  Cellulitis of right upper extremity  History of kidney transplant  Immunosuppression due to drug therapy [facility])     Discharge Instructions      We are going to cover you for an infection.  Take Keflex 4 times daily as well as doxycycline twice daily.  We will contact you if your lab work is abnormal.  Keep this area clean.  Monitor how much redness there is.  If this is spreading or if you develop any additional symptoms including fever, nausea, vomiting, body aches, weakness you need to go to the emergency room immediately.  Please follow-up with your PCP tomorrow.     ED Prescriptions     Medication Sig Dispense Auth. Provider   cephALEXin (KEFLEX) 250 MG capsule Take 1 capsule (250 mg total) by mouth 4 (four) times daily. 28 capsule Davonne Jarnigan K, PA-C   doxycycline (VIBRAMYCIN) 100 MG capsule Take 1 capsule (100 mg total) by mouth 2 (two) times daily. 14 capsule Ramari Bray K, PA-C      PDMP not reviewed this encounter.   Terrilee Croak, PA-C 09/04/21 1153

## 2021-09-04 NOTE — Discharge Instructions (Addendum)
We are going to cover you for an infection.  Take Keflex 4 times daily as well as doxycycline twice daily.  We will contact you if your lab work is abnormal.  Keep this area clean.  Monitor how much redness there is.  If this is spreading or if you develop any additional symptoms including fever, nausea, vomiting, body aches, weakness you need to go to the emergency room immediately.  Please follow-up with your PCP tomorrow.

## 2021-09-04 NOTE — ED Triage Notes (Signed)
Pt c/o possible infected wound to right elbow after scratching on car door. States he has a different sensation in right hand. Reports being a kidney transplant pt.

## 2021-09-05 LAB — CBC WITH DIFFERENTIAL/PLATELET
Basophils Absolute: 0.1 10*3/uL (ref 0.0–0.2)
Basos: 1 %
EOS (ABSOLUTE): 0.1 10*3/uL (ref 0.0–0.4)
Eos: 1 %
Hematocrit: 44.7 % (ref 37.5–51.0)
Hemoglobin: 15.3 g/dL (ref 13.0–17.7)
Immature Grans (Abs): 0 10*3/uL (ref 0.0–0.1)
Immature Granulocytes: 0 %
Lymphocytes Absolute: 2 10*3/uL (ref 0.7–3.1)
Lymphs: 17 %
MCH: 29.7 pg (ref 26.6–33.0)
MCHC: 34.2 g/dL (ref 31.5–35.7)
MCV: 87 fL (ref 79–97)
Monocytes Absolute: 1.1 10*3/uL — ABNORMAL HIGH (ref 0.1–0.9)
Monocytes: 9 %
Neutrophils Absolute: 8.4 10*3/uL — ABNORMAL HIGH (ref 1.4–7.0)
Neutrophils: 72 %
Platelets: 297 10*3/uL (ref 150–450)
RBC: 5.15 x10E6/uL (ref 4.14–5.80)
RDW: 11.9 % (ref 11.6–15.4)
WBC: 11.7 10*3/uL — ABNORMAL HIGH (ref 3.4–10.8)

## 2021-09-05 LAB — BASIC METABOLIC PANEL
BUN/Creatinine Ratio: 15 (ref 9–20)
BUN: 17 mg/dL (ref 6–24)
CO2: 18 mmol/L — ABNORMAL LOW (ref 20–29)
Calcium: 9.7 mg/dL (ref 8.7–10.2)
Chloride: 102 mmol/L (ref 96–106)
Creatinine, Ser: 1.1 mg/dL (ref 0.76–1.27)
Glucose: 113 mg/dL — ABNORMAL HIGH (ref 70–99)
Potassium: 4.3 mmol/L (ref 3.5–5.2)
Sodium: 137 mmol/L (ref 134–144)
eGFR: 86 mL/min/{1.73_m2} (ref >59)

## 2021-10-05 ENCOUNTER — Encounter (INDEPENDENT_AMBULATORY_CARE_PROVIDER_SITE_OTHER): Payer: 59 | Admitting: Ophthalmology

## 2021-10-05 DIAGNOSIS — H35439 Paving stone degeneration of retina, unspecified eye: Secondary | ICD-10-CM | POA: Diagnosis not present

## 2021-10-05 DIAGNOSIS — I10 Essential (primary) hypertension: Secondary | ICD-10-CM

## 2021-10-05 DIAGNOSIS — H43813 Vitreous degeneration, bilateral: Secondary | ICD-10-CM

## 2021-10-05 DIAGNOSIS — H35033 Hypertensive retinopathy, bilateral: Secondary | ICD-10-CM | POA: Diagnosis not present

## 2021-11-09 ENCOUNTER — Encounter: Payer: Self-pay | Admitting: Internal Medicine

## 2021-11-22 DIAGNOSIS — Z9189 Other specified personal risk factors, not elsewhere classified: Secondary | ICD-10-CM | POA: Insufficient documentation

## 2021-11-22 DIAGNOSIS — R7303 Prediabetes: Secondary | ICD-10-CM | POA: Insufficient documentation

## 2022-01-16 DIAGNOSIS — M1711 Unilateral primary osteoarthritis, right knee: Secondary | ICD-10-CM | POA: Insufficient documentation

## 2023-01-27 DIAGNOSIS — Z789 Other specified health status: Secondary | ICD-10-CM | POA: Insufficient documentation

## 2023-09-25 ENCOUNTER — Encounter: Payer: Self-pay | Admitting: Internal Medicine

## 2023-10-17 ENCOUNTER — Other Ambulatory Visit: Payer: Self-pay

## 2023-11-12 ENCOUNTER — Ambulatory Visit (AMBULATORY_SURGERY_CENTER)

## 2023-11-12 ENCOUNTER — Encounter: Payer: Self-pay | Admitting: Internal Medicine

## 2023-11-12 VITALS — Ht 74.0 in | Wt 285.0 lb

## 2023-11-12 DIAGNOSIS — K51919 Ulcerative colitis, unspecified with unspecified complications: Secondary | ICD-10-CM

## 2023-11-12 DIAGNOSIS — K602 Anal fissure, unspecified: Secondary | ICD-10-CM

## 2023-11-12 MED ORDER — NA SULFATE-K SULFATE-MG SULF 17.5-3.13-1.6 GM/177ML PO SOLN: 1.0000 | Freq: Once | ORAL | 0 refills | Status: AC

## 2023-11-12 NOTE — Progress Notes (Signed)
 No egg or soy allergy known to patient  No issues known to pt with past sedation with any surgeries or procedures Patient denies ever being told they had issues or difficulty with intubation  No FH of Malignant Hyperthermia Pt is not on diet pills Pt is not on  home 02  Pt is not on blood thinners  Pt denies issues with constipation  No A fib or A flutter Have any cardiac testing pending--no Pt can ambulate independently Pt denies use of chewing tobacco Discussed diabetic I weight loss medication holds Discussed NSAID holds Checked BMI Pt instructed to use [employer] or GoodRx for a price reduction on prep  Patient's chart reviewed by Joe [name] CNRA prior to previsit and patient appropriate for the [facility].  Pre visit completed and red dot placed by patient's name on their procedure day (on provider's schedule).

## 2023-11-22 ENCOUNTER — Telehealth: Payer: Self-pay | Admitting: Internal Medicine

## 2023-11-22 DIAGNOSIS — K51919 Ulcerative colitis, unspecified with unspecified complications: Secondary | ICD-10-CM

## 2023-11-22 DIAGNOSIS — K602 Anal fissure, unspecified: Secondary | ICD-10-CM

## 2023-11-22 MED ORDER — SUPREP BOWEL PREP KIT 17.5-3.13-1.6 GM/177ML PO SOLN: 1.0000 | Freq: Once | ORAL | 0 refills | Status: AC

## 2023-11-22 NOTE — Telephone Encounter (Signed)
 Received a call from patient requesting nurse fu call to discus prep instructions and medication management, scheduled for 10/6. Please review and advise   Thank you

## 2023-11-22 NOTE — Telephone Encounter (Signed)
 Called patient and went over prescription and instructions.  Sent new prescription to [facility].

## 2023-11-26 ENCOUNTER — Ambulatory Visit: Admitting: Internal Medicine

## 2023-11-26 ENCOUNTER — Encounter: Payer: Self-pay | Admitting: Internal Medicine

## 2023-11-26 VITALS — BP 108/65 | HR 70 | Temp 98.2°F | Resp 14 | Ht 74.0 in | Wt 285.0 lb

## 2023-11-26 DIAGNOSIS — K51919 Ulcerative colitis, unspecified with unspecified complications: Secondary | ICD-10-CM

## 2023-11-26 DIAGNOSIS — K529 Noninfective gastroenteritis and colitis, unspecified: Secondary | ICD-10-CM | POA: Diagnosis not present

## 2023-11-26 DIAGNOSIS — Z1211 Encounter for screening for malignant neoplasm of colon: Secondary | ICD-10-CM

## 2023-11-26 MED ORDER — SODIUM CHLORIDE 0.9 % IV SOLN: 500.0000 mL | Freq: Once | INTRAVENOUS | Status: DC

## 2023-11-26 NOTE — Progress Notes (Signed)
 To PACU via stretcher, sedated, good respiratory effort, VSS.

## 2023-11-26 NOTE — Op Note (Signed)
 [facility] Patient Name: Joe [name] Procedure Date: 11/26/2023 8:44 AM MRN: [id number] Endoscopist: Norleen SAILOR. Abran , MD, [id number] Age: 43 Referring MD:  Date of Birth: 10-03-1980 Gender: Male Account #: [id number] Procedure:                Colonoscopy with biopsies Indications:              High risk colon cancer surveillance: Inflammatory                            bowel disease (unclassified) of 8 (or more) years                            duration with one-third (or more) of the colon                            involved. Diagnosed with pancolitis in 2000.                            Initially felt to be UC. He subsequently developed                            problems with perirectal abscess and fistulous                            disease. As well, colon biopsies on most recent                            colonoscopy demonstrated illness. Thus, felt to                            have Crohn's. Most recent colonoscopy was 2016 and                            2020 Medicines:                Monitored Anesthesia Care Procedure:                Pre-Anesthesia Assessment:                           - Prior to the procedure, a History and Physical                            was performed, and patient medications and                            allergies were reviewed. The patient's tolerance of                            previous anesthesia was also reviewed. The risks                            and benefits of the procedure and the sedation  options and risks were discussed with the patient.                            All questions were answered, and informed consent                            was obtained. Prior Anticoagulants: The patient has                            taken no anticoagulant or antiplatelet agents. ASA                            Grade Assessment: III - A patient with severe                            systemic disease. After  reviewing the risks and                            benefits, the patient was deemed in satisfactory                            condition to undergo the procedure.                           After obtaining informed consent, the colonoscope                            was passed under direct vision. Throughout the                            procedure, the patient's blood pressure, pulse, and                            oxygen saturations were monitored continuously. The                            Olympus Scope SN: [id number] was introduced through                            the anus and advanced to the the cecum, identified                            by appendiceal orifice and ileocecal valve. The                            terminal ileum, ileocecal valve, appendiceal                            orifice, and rectum were photographed. The quality                            of the bowel preparation was excellent. The  colonoscopy was performed without difficulty. The                            patient tolerated the procedure well. The bowel                            preparation used was SUPREP via split dose                            instruction. Scope In: 8:58:53 AM Scope Out: 9:11:23 AM Scope Withdrawal Time: 0 hours 10 minutes 49 seconds  Total Procedure Duration: 0 hours 12 minutes 30 seconds  Findings:                 The terminal ileum contained a few aphthae.                            Biopsies were taken with a cold forceps for                            histology.                           The entire examined colon appeared normal on direct                            and retroflexion views. Surveillance biopsies were                            taken with a cold forceps, from the right and left                            colon, for histology. Complications:            No immediate complications. Estimated blood loss:                            None. Estimated  Blood Loss:     Estimated blood loss: none. Impression:               - Aphtha in the terminal ileum. Biopsied.                           - The entire examined colon is normal on direct and                            retroflexion views. Recommendation:           - Repeat colonoscopy in 3 years for surveillance.                           - Patient has a contact number available for                            emergencies. The signs and symptoms of potential  delayed complications were discussed with the                            patient. Return to normal activities tomorrow.                            Written discharge instructions were provided to the                            patient.                           - Resume previous diet.                           - Continue present medications.                           - Await pathology results. Norleen SAILOR. Abran, MD 11/26/2023 9:20:09 AM This report has been signed electronically.

## 2023-11-26 NOTE — Progress Notes (Signed)
 HISTORY OF PRESENT ILLNESS:  Joe [name] is a 43 y.o. male diagnosed with universal chronic colitis in 2000 (felt to be UC).  Subsequently developed issues with perirectal abscess and fistula.  Biopsies on most recent colonoscopy showed granulomatous features in the colonic biopsies.  The ileum is normal.  Last colonoscopy 2020.  Now for surveillance  REVIEW OF SYSTEMS:  All non-GI ROS negative except for  Past Medical History:  Diagnosis Date   Anal fissure    Anxiety    Arthritis    Cancer ([facility])    skin cancer/squamous cell on face   Chronic renal failure 09/2006   post kidney transplantation   GERD (gastroesophageal reflux disease)    Hypertension    Internal hemorrhoids    Pancreatitis    Ulcerative colitis ([facility])     Past Surgical History:  Procedure Laterality Date   ANTERIOR CRUCIATE LIGAMENT REPAIR Left    ANUS SURGERY  2018   APPENDECTOMY     KIDNEY TRANSPLANT  2008   KNEE CARTILAGE SURGERY Left    x2   KNEE SURGERY  2018   right knee   MENISCUS REPAIR Left    STOMACH SURGERY     UMBILICAL HERNIA REPAIR      Social History Joe [name]  reports that he has never smoked. He quit smokeless tobacco use about 18 years ago. He reports current alcohol use. He reports that he does not use drugs.  family history includes Diabetes in his paternal grandmother; Hyperthyroidism in his father; Kidney cancer in his maternal grandfather; Ovarian cancer in his mother.  Allergies  Allergen Reactions   Midazolam     Other reaction(s): Other (See Comments) Pt states that he becomes extremely combative and aggressive when he received Versed   Piperacillin-Tazobactam In Dex Rash    Got zosyn and developed rash/hives/burning. Patient tolerated Keflex .    Mercaptopurine Other (See Comments)    Other Reaction: fever, chills   Mesalamine Other (See Comments)    Other Reaction: ESRD   Mycophenolate Mofetil     Other reaction(s): Other (See Comments) Headaches        PHYSICAL EXAMINATION: Vital signs: BP 134/61   Pulse 76   Temp 98.2 F (36.8 C)   Ht 6' 2 (1.88 m)   Wt 285 lb (129.3 kg)   SpO2 100%   BMI 36.59 kg/m  General: Well-developed, well-nourished, no acute distress HEENT: Sclerae are anicteric, conjunctiva pink. Oral mucosa intact Lungs: Clear Heart: Regular Abdomen: soft, nontender, nondistended, no obvious ascites, no peritoneal signs, normal bowel sounds. No organomegaly. Extremities: No edema Psychiatric: alert and oriented x3. Cooperative     ASSESSMENT:  Crohn's colitis   PLAN:   Surveillance colonoscopy

## 2023-11-26 NOTE — Patient Instructions (Signed)
 Thank you for letting us  take care of your healthcare needs today. Await pathology results.     YOU HAD AN ENDOSCOPIC PROCEDURE TODAY AT THE [facility]:   Refer to the procedure report that was given to you for any specific questions about what was found during the examination.  If the procedure report does not answer your questions, please call your gastroenterologist to clarify.  If you requested that your care partner not be given the details of your procedure findings, then the procedure report has been included in a sealed envelope for you to review at your convenience later.  YOU SHOULD EXPECT: Some feelings of bloating in the abdomen. Passage of more gas than usual.  Walking can help get rid of the air that was put into your GI tract during the procedure and reduce the bloating. If you had a lower endoscopy (such as a colonoscopy or flexible sigmoidoscopy) you may notice spotting of blood in your stool or on the toilet paper. If you underwent a bowel prep for your procedure, you may not have a normal bowel movement for a few days.  Please Note:  You might notice some irritation and congestion in your nose or some drainage.  This is from the oxygen used during your procedure.  There is no need for concern and it should clear up in a day or so.  SYMPTOMS TO REPORT IMMEDIATELY:  Following lower endoscopy (colonoscopy or flexible sigmoidoscopy):  Excessive amounts of blood in the stool  Significant tenderness or worsening of abdominal pains  Swelling of the abdomen that is new, acute  Fever of 100F or higher    For urgent or emergent issues, a gastroenterologist can be reached at any hour by calling [phone]. Do not use MyChart messaging for urgent concerns.    DIET:  We do recommend a small meal at first, but then you may proceed to your regular diet.  Drink plenty of fluids but you should avoid alcoholic beverages for 24 hours.  ACTIVITY:  You should plan to take  it easy for the rest of today and you should NOT DRIVE or use heavy machinery until tomorrow (because of the sedation medicines used during the test).    FOLLOW UP: Our staff will call the number listed on your records the next business day following your procedure.  We will call around 7:15- 8:00 am to check on you and address any questions or concerns that you may have regarding the information given to you following your procedure. If we do not reach you, we will leave a message.     If any biopsies were taken you will be contacted by phone or by letter within the next 1-3 weeks.  Please call us  at [phone] if you have not heard about the biopsies in 3 weeks.    SIGNATURES/CONFIDENTIALITY: You and/or your care partner have signed paperwork which will be entered into your electronic medical record.  These signatures attest to the fact that that the information above on your After Visit Summary has been reviewed and is understood.  Full responsibility of the confidentiality of this discharge information lies with you and/or your care-partner.

## 2023-11-26 NOTE — Progress Notes (Signed)
 Called to room to assist during endoscopic procedure.  Patient ID and intended procedure confirmed with present staff. Received instructions for my participation in the procedure from the performing physician.

## 2023-11-26 NOTE — Progress Notes (Signed)
 Pt's states no medical or surgical changes since previsit or office visit.

## 2023-11-27 ENCOUNTER — Telehealth: Payer: Self-pay

## 2023-11-27 NOTE — Telephone Encounter (Signed)
  Follow up Call-     11/26/2023    7:57 AM  Call back number  Post procedure Call Back phone  # [phone]  Permission to leave phone message Yes    Attempted to call regarding follow-up. No answer, VM left.

## 2023-11-28 ENCOUNTER — Ambulatory Visit: Payer: Self-pay | Admitting: Internal Medicine

## 2023-11-28 LAB — SURGICAL PATHOLOGY

## 2024-03-28 ENCOUNTER — Other Ambulatory Visit (HOSPITAL_BASED_OUTPATIENT_CLINIC_OR_DEPARTMENT_OTHER): Payer: Self-pay | Admitting: Family Medicine

## 2024-03-28 DIAGNOSIS — Z8249 Family history of ischemic heart disease and other diseases of the circulatory system: Secondary | ICD-10-CM

## 2024-04-03 ENCOUNTER — Other Ambulatory Visit (HOSPITAL_BASED_OUTPATIENT_CLINIC_OR_DEPARTMENT_OTHER)
